# Patient Record
Sex: Female | Born: 1955
Health system: Southern US, Community
[De-identification: ages and names within clinical notes are randomized; demographics above are authoritative.]

## PROBLEM LIST (undated history)

## (undated) DIAGNOSIS — E559 Vitamin D deficiency, unspecified: Secondary | ICD-10-CM

## (undated) DIAGNOSIS — R32 Unspecified urinary incontinence: Secondary | ICD-10-CM

## (undated) DIAGNOSIS — D649 Anemia, unspecified: Secondary | ICD-10-CM

## (undated) DIAGNOSIS — K635 Polyp of colon: Secondary | ICD-10-CM

## (undated) DIAGNOSIS — R159 Full incontinence of feces: Secondary | ICD-10-CM

## (undated) DIAGNOSIS — L930 Discoid lupus erythematosus: Secondary | ICD-10-CM

## (undated) DIAGNOSIS — E7800 Pure hypercholesterolemia, unspecified: Secondary | ICD-10-CM

## (undated) HISTORY — PX: FOOT SURGERY: SHX648

## (undated) HISTORY — PX: CARPAL TUNNEL RELEASE: SHX101

## (undated) HISTORY — DX: Anemia, unspecified: D64.9

## (undated) HISTORY — PX: BREAST EXCISIONAL BIOPSY: SUR124

## (undated) HISTORY — DX: Unspecified urinary incontinence: R32

## (undated) HISTORY — DX: Polyp of colon: K63.5

## (undated) HISTORY — DX: Full incontinence of feces: R15.9

## (undated) HISTORY — DX: Discoid lupus erythematosus: L93.0

## (undated) HISTORY — DX: Vitamin D deficiency, unspecified: E55.9

---

## 1997-06-07 ENCOUNTER — Ambulatory Visit (HOSPITAL_COMMUNITY): Admission: RE | Admit: 1997-06-07 | Discharge: 1997-06-07 | Payer: Self-pay | Admitting: Internal Medicine

## 1997-06-11 ENCOUNTER — Ambulatory Visit (HOSPITAL_COMMUNITY): Admission: RE | Admit: 1997-06-11 | Discharge: 1997-06-11 | Payer: Self-pay | Admitting: Internal Medicine

## 1999-01-11 ENCOUNTER — Encounter: Payer: Self-pay | Admitting: Internal Medicine

## 1999-01-11 ENCOUNTER — Ambulatory Visit (HOSPITAL_COMMUNITY): Admission: RE | Admit: 1999-01-11 | Discharge: 1999-01-11 | Payer: Self-pay | Admitting: Internal Medicine

## 2004-09-25 ENCOUNTER — Ambulatory Visit (HOSPITAL_COMMUNITY): Admission: RE | Admit: 2004-09-25 | Discharge: 2004-09-25 | Payer: Self-pay | Admitting: Gastroenterology

## 2009-10-31 ENCOUNTER — Ambulatory Visit (HOSPITAL_COMMUNITY): Admission: RE | Admit: 2009-10-31 | Discharge: 2009-10-31 | Payer: Self-pay | Admitting: Gastroenterology

## 2009-10-31 DIAGNOSIS — E119 Type 2 diabetes mellitus without complications: Secondary | ICD-10-CM | POA: Insufficient documentation

## 2010-04-23 ENCOUNTER — Encounter: Payer: Self-pay | Admitting: Obstetrics and Gynecology

## 2010-06-12 ENCOUNTER — Ambulatory Visit: Payer: Commercial Managed Care - PPO | Admitting: Family Medicine

## 2010-06-12 DIAGNOSIS — E669 Obesity, unspecified: Secondary | ICD-10-CM

## 2010-06-12 DIAGNOSIS — L930 Discoid lupus erythematosus: Secondary | ICD-10-CM

## 2010-06-12 DIAGNOSIS — E785 Hyperlipidemia, unspecified: Secondary | ICD-10-CM

## 2010-06-12 NOTE — Progress Notes (Signed)
  Medical Nutrition Therapy:  Appt start time: 0930 end time:  1030.  Assessment:  Primary concerns today: Blood sugar control. Usual eating pattern is erratic.  Maize sometimes skips meals; usually eats something for bkfst after getting to work; sometimes snacks through dinner.  Her husband works at [employer], and he often brings home food for the two kids (age 53 & 34).  Avoided foods include certain veg's.  Everyday foods or beverages include water only, and a max of 1 regular soda/day (usually less).  No usual physical activity.  24-hr recall suggests intake of <700 Kcal (up ~7:30): B- none; L (2 PM)- 1 c green beans w/ marg, 1 c potato salad, 4 oz ham, 1 pc corn bread, water.  Burgess Estelle was a typical Sunday, but other days usually include more meals/snacks.  No BG tested yesterday.  FBG has been 95-140.  Correen started taking Janumet 2 X day in Feb, but has cut back to 1 pill/day b/c bedtime BG has often been fairly low, i.e., 89.  She is not taking the one Janumet at consistent times during the day:  Sometimes at bkfast, sometimes at lunch, sometimes missing altogether.    Progress Towards Goal(s):  In progress.   Intervention:  Nutrition education.    Nutritional Diagnosis:  NB-2.1 Physical inactivity As related to time constraints and poor motivation.  As evidenced by no regular exercise.  Additional concern is erratic eating schedule and skipping meals.      Monitoring/Evaluation:  Dietary intake in 4 week(s).

## 2010-06-12 NOTE — Patient Instructions (Addendum)
-   Call Dr. Alberteen Sam to ask if she wants you to keep taking your Rx vitamin D for 6 months.  If you are not getting good results with the Rx you're using, ask Dr. Alberteen Sam to explore the option of Replesta, a D3 preparation in 50,000 IU form.   - If you have an especially high BG reading or a very low reading, make a note as to likely reason(s), i.e., when and what was the last meal/snack you ate?  Was the high or low related to medication? - Get on a consistent schedule of both eating and medication (Janumet pre-bkfst) for your blood sugar.   - Eat at least 3 meals and 1-2 snacks per day.  Aim for no more than 5 hours between eating.  - Breakfast options:  Lean meat sandwich on whole wheat; egg salad sandwich with low-fat mayo; 2 hard-boiled eggs with whole-grain bagel; yogurt and fresh fruit; high-fiber cereal (at least 5 g fiber per serving) with non-fat milk or yogurt.   - Obtain twice as many veg's as protein or carbohydrate foods for both lunch and dinner. - Switch to 2% milk for about a month, then to 1% for a couple of months, and eventually:  Non-fat milk.   - PLAN AHEAD for this to work well.   - Physical activity: Get up and move at least once an hour (exercise opportunities).   - Physical activity goal:  Walk at least 15 minutes 3 X wk.  Record # minutes walked daily on BG log.

## 2010-06-16 LAB — GLUCOSE, CAPILLARY: Glucose-Capillary: 223 mg/dL — ABNORMAL HIGH (ref 70–99)

## 2010-07-10 ENCOUNTER — Ambulatory Visit (INDEPENDENT_AMBULATORY_CARE_PROVIDER_SITE_OTHER): Payer: Commercial Managed Care - PPO | Admitting: Family Medicine

## 2010-07-10 DIAGNOSIS — E119 Type 2 diabetes mellitus without complications: Secondary | ICD-10-CM

## 2010-07-10 NOTE — Progress Notes (Signed)
  Medical Nutrition Therapy:  Appt start time: 0930 end time:  1030.  Assessment:  Primary concerns today: Blood sugar control. Theresa Meyers has been attending Zumba class 1 X wk and Body Pump class 1 X wk.  She has been eating breakfast most mornings, but still has some days of no bkfst or of skipping lunch on a busy weekend day. She has switched to 2% milk, and has been off sodas for 40 days.  BG have been down some in the past month; 30-day avg is 109.  She has had two high FBG (140), which she could not associate with specific foods the previous day, although she did not consider meals/snacks prior to the previous night's dinner.  She admits she needs to do a better job of planning ahead for meals.    Progress Towards Goal(s):  In progress.   Intervention:  Nutrition education.    Nutritional Diagnosis:  NB-2.1 Some progress on physical inactivity as related to time constraints as evidenced by 2 exercise classes a week.  Some progress also on erratic eating schedule and skipping meals.     Monitoring/Evaluation:  Dietary intake in 4 weeks.

## 2010-07-10 NOTE — Patient Instructions (Addendum)
-   Walking after work:  Consider places you can go that will continue to make it interesting for you.   - Seven days a week, 24 hrs a day = 168 hours; subtract 7 hours of sleep a night = 119 hours. Does exercising 2 hours sound like it's enough? - Exercise opportunities:  Move as often as possible.  - Google:  Non-exercise activity thermogenesis (NEAT) and Google Lafonda Mosses (sugar research).   - Daily food record.  Use this when you see a BG that is high or low to determine why.   - Reminder:  Eat at least 3 meals and 1-2 snacks per day.  Aim for no more than 5 hours between eating.  It's important to make eating a priority.   - VEGETABLES:  Aim for 2 X day, and A LOT when you eat them.  If you don't include a veg with lunch or dinner, then at least have a fruit.  Consider using the microwave for veg's.   - PLAN AHEAD FOR MEALS AND SNACKS.

## 2010-08-05 ENCOUNTER — Inpatient Hospital Stay (INDEPENDENT_AMBULATORY_CARE_PROVIDER_SITE_OTHER)
Admission: RE | Admit: 2010-08-05 | Discharge: 2010-08-05 | Disposition: A | Discharge status: Home / Self Care | Payer: 59 | Source: Ambulatory Visit | Attending: Family Medicine | Admitting: Family Medicine

## 2010-08-05 ENCOUNTER — Encounter: Payer: Self-pay | Admitting: Family Medicine

## 2010-08-05 DIAGNOSIS — T783XXA Angioneurotic edema, initial encounter: Secondary | ICD-10-CM

## 2010-08-05 DIAGNOSIS — E785 Hyperlipidemia, unspecified: Secondary | ICD-10-CM | POA: Insufficient documentation

## 2010-08-07 ENCOUNTER — Telehealth (INDEPENDENT_AMBULATORY_CARE_PROVIDER_SITE_OTHER): Payer: Self-pay | Admitting: *Deleted

## 2010-08-07 ENCOUNTER — Ambulatory Visit (INDEPENDENT_AMBULATORY_CARE_PROVIDER_SITE_OTHER): Payer: 59 | Admitting: Family Medicine

## 2010-08-07 DIAGNOSIS — E119 Type 2 diabetes mellitus without complications: Secondary | ICD-10-CM

## 2010-08-07 NOTE — Progress Notes (Signed)
  Medical Nutrition Therapy:  Appt start time: 0930 end time:  1015.  Assessment:  Primary concerns today: Blood sugar control. Theresa Meyers has not been exercising as much as she had planned.  FBG have been good; <100 most mornings.  Usual intk on weekdays:  B- 2 boiled eggs, 1 Yoplait yogurt with granola;  L- [place] sandwich w/ regular Jell-O or popcorn; Snk- maybe fruit or pb crackers; D- sporadic (may be bought sandwich or grilled chx).  She is getting veg's 2-3 X wk, and fruit daily.  Theresa Meyers acknowledges that advance planning will be crucial to better food choices and to exercise.    Progress Towards Goal(s):  In progress.   Intervention:  Nutrition education.    Nutritional Diagnosis:  NB-2.1 No progress on physical inactivity as related to time constraints as evidenced by sporadic exercise recently.  Some continued progress on erratic eating schedule and skipping meals.     Monitoring/Evaluation:  Dietary intake in 4 weeks.

## 2010-08-07 NOTE — Patient Instructions (Addendum)
-   Daily food record.  This may be helpful in determining what's causing your lip inflammation.   - While taking prednisone, be especially cautious about sweets and carb's  In your diet (try to limit).   - Increase veg's; include veg's at least once a day.  Remember SALADS, and use your microwave for veg's.  Be sure to keep frozen veg's on hand.  Try stir-frying greens.   - Google:  Lafonda Mosses, and listen to one of his videos.   - Use your desk exercises list, and continue to try to walk briefly at least once an hour while at work.   - Walking goal:  At least 15 min 5 X wk.   - The key to staying on track with making good food choices and to getting exercise is PLANNING AHEAD.

## 2010-08-18 NOTE — Op Note (Signed)
Theresa Meyers, Theresa Meyers                ACCOUNT NO.:  [id number]   MEDICAL RECORD NO.:  [id number]          PATIENT TYPE:  AMB   LOCATION:  ENDO                         FACILITY:  [facility]   PHYSICIAN:  Anselmo Rod, M.D.  DATE OF BIRTH:  03-27-1956   DATE OF PROCEDURE:  09/25/2004  DATE OF DISCHARGE:                                 OPERATIVE REPORT   PROCEDURE PERFORMED:  Screening colonoscopy.   ENDOSCOPIST:  Anselmo Rod, M.D.   INSTRUMENT USED:  Olympus video colonoscope.   INDICATIONS FOR PROCEDURE:  A 55 year old, African-American female  undergoing a screening colonoscopy.  The patient has a history of occasional  rectal bleeding, constipation, family history of colon cancer in father.  To  rule out colonic polyps, masses, etc.   PRE-PROCEDURE PREPARATION:  Informed consent was procured from the patient.  The patient was fasted for eight hours prior to the procedure and prepped  with a bottle of magnesium citrate and a gallon of GoLYTELY the night prior  to the procedure.  The risks and benefits of the procedure including a 10%  miss rate of cancer and polyps were discussed with the patient as well.   PRE-PROCEDURE PHYSICAL:  VITAL SIGNS:  The patient had stable vital signs.  NECK:  Supple.  CHEST:  Clear to auscultation.  CARDIOVASCULAR:  S1, S2 regular.  ABDOMEN:  Soft, with normal bowel sounds.   DESCRIPTION OF THE PROCEDURE:  The patient was placed in the left lateral  decubitus position, sedated with 60 mg of Demerol and 7.5 mg of Versed in  slow incremental doses.  Once the patient was adequately sedated and  maintained on low-flow oxygen and continuous cardiac monitoring, the Olympus  video colonoscope was advanced from the rectum to the cecum.  The  appendiceal orifice and the ileocecal valve were clearly visualized and  photographed.  No masses, polyps, erosions, ulcerations, or diverticula were  seen.  Small internal hemorrhoids were appreciated on retroflexion  in the  rectum.  There was a significant amount of residual stool in the colon, as  the patient did not consume her prep completely.  Multiple washes were done.  Small lesions could be missed.   IMPRESSION:  1.  Normal colonoscopy up to the cecum.  No masses, polyps, or diverticula      seen.  2.  Small internal hemorrhoids seen on retroflexion.   RECOMMENDATIONS:  1.  Repeat colonoscopy has been recommended in the next five years unless      the patient develops any abnormal      symptoms in the interim.  2.  Continue a high-fiber diet with liberal fluid intake.  Use stool      softeners as needed.  3.  Outpatient followup as the need arises in the future.       JNM/MEDQ  D:  09/25/2004  T:  09/25/2004  Job:  [id number]   cc:   Birdena Jubilee, MD  [address]  [place]  [place] [address]  Fax: [phone]   Annye Rusk, M.D.  [facility]  [employer]  [place]

## 2010-09-05 ENCOUNTER — Ambulatory Visit: Payer: 59 | Admitting: Family Medicine

## 2011-03-05 NOTE — Progress Notes (Signed)
Summary: LIPS BURNING/WSE   Vital Signs:  Patient Profile:   55 Years Old Female CC:      burning lips Height:     62.5 inches Weight:      190 pounds O2 Sat:      100 % O2 treatment:    Room Air Temp:     97.7 degrees F oral Pulse rate:   82 / minute Resp:     20 per minute BP sitting:   130 / 87  (left arm) Cuff size:   regular  Vitals Entered By: Linton Flemings RN (Aug 05, 2010 5:44 PM)                  Updated Prior Medication List: Irven Coe * PRAVACHOL daily  History of Present Illness Chief Complaint: burning lips History of Present Illness:  Subjective:  Patient complains of onset of itchy lips two days ago without swelling or pain, and no lesions.  No sores inside of mouth.  She feels well otherwise.  She uses lipstick but has had no recent change.  She has had a similar episode in the past that did involve swelling, and it resolved spontaneously.  No recent change in meds.  REVIEW OF SYSTEMS Constitutional Symptoms      Denies fever, chills, night sweats, weight loss, weight gain, and fatigue.  Eyes       Denies change in vision, eye pain, eye discharge, glasses, contact lenses, and eye surgery. Ear/Nose/Throat/Mouth       Denies hearing loss/aids, change in hearing, ear pain, ear discharge, dizziness, frequent runny nose, frequent nose bleeds, sinus problems, sore throat, hoarseness, and tooth pain or bleeding.  Respiratory       Denies dry cough, productive cough, wheezing, shortness of breath, asthma, bronchitis, and emphysema/COPD.  Cardiovascular       Denies murmurs, chest pain, and tires easily with exhertion.    Gastrointestinal       Denies stomach pain, nausea/vomiting, diarrhea, constipation, blood in bowel movements, and indigestion. Genitourniary       Denies painful urination, kidney stones, and loss of urinary control. Neurological       Complains of numbness and tingling.      Denies paralysis, seizures, and  fainting/blackouts. Musculoskeletal       Denies muscle pain, joint pain, joint stiffness, decreased range of motion, redness, swelling, muscle weakness, and gout.  Skin       Denies bruising, unusual mles/lumps or sores, and hair/skin or nail changes.  Psych       Denies mood changes, temper/anger issues, anxiety/stress, speech problems, depression, and sleep problems. Other Comments: states burning to lips and outter portion of lips has darken x 3 days   Past History:  Past Medical History: DM Hyperlipidemia  Past Surgical History: Denies surgical history  Family History: none  Social History: smoke- no alcohol- no rec, drugs- no   Objective:  No acute distress  Eyes:  Pupils are equal, round, and reactive to light and accomdation.  Extraocular movement is intact.  Conjunctivae are not inflamed.  Lips:  No obvious swelling.  No lesions.  No tenderness.  No erythema. Mouth:  No lesions.  No gingival tenderness Pharynx:  Normal  Neck:  Supple.  No adenopathy is present.  No thyromegaly is present  Assessment New Problems: ANGIOEDEMA (ICD-995.1) HYPERLIPIDEMIA (ICD-272.4)  SUSPECT A VERY MILD MANIFESTATION OF ANGIOEDEMA  Plan New Medications/Changes: PREDNISONE 10 MG TABS (PREDNISONE) 2 PO today, then 2 BID for  1 day, then 1 two times a day for 2 days, then 1 daily for 2 days.  Take PC  #12 x 0, 08/05/2010, Donna Christen MD  New Orders: New Patient Level III [phone] Services provided After hours-Weekends-Holidays [99051] Planning Comments:   Short tapering course of prednisone.  Advised to stop using lipstick, lip balms, etc, until resolved. Follow-up with ENT if not improving.   The patient and/or caregiver has been counseled thoroughly with regard to medications prescribed including dosage, schedule, interactions, rationale for use, and possible side effects and they verbalize understanding.  Diagnoses and expected course of recovery discussed and will return if  not improved as expected or if the condition worsens. Patient and/or caregiver verbalized understanding.  Prescriptions: PREDNISONE 10 MG TABS (PREDNISONE) 2 PO today, then 2 BID for 1 day, then 1 two times a day for 2 days, then 1 daily for 2 days.  Take PC  #12 x 0   Entered and Authorized by:   Donna Christen MD   Signed by:   Donna Christen MD on 08/05/2010   Method used:   Print then Give to Patient   RxID:   [id number]   Orders Added: 1)  New Patient Level III [95621] 2)  Services provided After hours-Weekends-Holidays [30865]

## 2011-03-05 NOTE — Telephone Encounter (Signed)
  Phone Note Outgoing Call Call back at [facility] Phone [phone] [facility]     Call placed by: Lajean Saver RN,  Aug 07, 2010 12:30 PM Call placed to: Patient Summary of Call: Callback: No answer. message left to call back with questions or concerns

## 2011-11-12 ENCOUNTER — Encounter (HOSPITAL_COMMUNITY): Payer: Self-pay | Admitting: *Deleted

## 2011-11-12 ENCOUNTER — Emergency Department (HOSPITAL_COMMUNITY)
Admission: EM | Admit: 2011-11-12 | Discharge: 2011-11-12 | Disposition: A | Discharge status: Home / Self Care | Payer: 59 | Source: Home / Self Care | Attending: Family Medicine | Admitting: Family Medicine

## 2011-11-12 DIAGNOSIS — S76311A Strain of muscle, fascia and tendon of the posterior muscle group at thigh level, right thigh, initial encounter: Secondary | ICD-10-CM

## 2011-11-12 HISTORY — DX: Pure hypercholesterolemia, unspecified: E78.00

## 2011-11-12 NOTE — ED Notes (Signed)
Reports slipping while getting dressed on Thursday, causing pain in right hamstring.  Has applied ice.

## 2011-11-12 NOTE — ED Provider Notes (Signed)
History     CSN: [id number]  Arrival date & time 11/12/11  1727   First MD Initiated Contact with Patient 11/12/11 1738      Chief Complaint  Patient presents with  . Leg Pain    (Consider location/radiation/quality/duration/timing/severity/associated sxs/prior treatment) Patient is a 56 y.o. female presenting with leg pain. The history is provided by the patient.  Leg Pain  The incident occurred more than 2 days ago. The incident occurred at home. The injury mechanism was a fall (slipped getting dressed felt pain in right post thigh.). The pain is present in the right thigh. The quality of the pain is described as burning and sharp. The pain is mild. The pain has been intermittent since onset.    Past Medical History  Diagnosis Date  . Diabetes mellitus   . Hypercholesteremia     Past Surgical History  Procedure Date  . Carpal tunnel release   . Foot surgery     No family history on file.  History  Substance Use Topics  . Smoking status: Never Smoker   . Smokeless tobacco: Not on file  . Alcohol Use: No    OB History    Grav Para Term Preterm Abortions TAB SAB Ect Mult Living                  Review of Systems  Constitutional: Negative.   Gastrointestinal: Negative.   Genitourinary: Negative.   Musculoskeletal: Positive for gait problem. Negative for back pain.  Neurological: Negative.     Allergies  Review of patient's allergies indicates no known allergies.  Home Medications   Current Outpatient Rx  Name Route Sig Dispense Refill  . ERGOCALCIFEROL 50000 UNITS PO CAPS Oral Take 50,000 Units by mouth 2 (two) times a week.      Marland Kitchen PRAVASTATIN SODIUM PO Oral Take by mouth at bedtime.      Marland Kitchen SITAGLIPTIN-METFORMIN HCL 50-1000 MG PO TABS Oral Take 1 tablet by mouth 1 dose over 46 hours.        BP 130/76  Pulse 76  Temp 97.3 F (36.3 C) (Oral)  Resp 18  SpO2 100%  Physical Exam  Nursing note and vitals reviewed. Constitutional: She is oriented to  person, place, and time. She appears well-developed and well-nourished.  Abdominal: Soft. Bowel sounds are normal.  Musculoskeletal: She exhibits tenderness.       Legs: Neurological: She is alert and oriented to person, place, and time.  Skin: Skin is warm and dry.    ED Course  Procedures (including critical care time)  Labs Reviewed - No data to display No results found.   1. Right hamstring muscle strain       MDM          Linna Hoff, MD 11/12/11 [phone]

## 2011-11-27 ENCOUNTER — Other Ambulatory Visit: Payer: Self-pay | Admitting: Obstetrics and Gynecology

## 2011-11-27 DIAGNOSIS — Z1231 Encounter for screening mammogram for malignant neoplasm of breast: Secondary | ICD-10-CM

## 2011-12-14 ENCOUNTER — Ambulatory Visit
Admission: RE | Admit: 2011-12-14 | Discharge: 2011-12-14 | Disposition: A | Discharge status: Home / Self Care | Payer: 59 | Source: Ambulatory Visit | Attending: Obstetrics and Gynecology | Admitting: Obstetrics and Gynecology

## 2011-12-14 DIAGNOSIS — Z1231 Encounter for screening mammogram for malignant neoplasm of breast: Secondary | ICD-10-CM

## 2011-12-27 ENCOUNTER — Other Ambulatory Visit: Payer: Self-pay | Admitting: Obstetrics and Gynecology

## 2011-12-27 DIAGNOSIS — R928 Other abnormal and inconclusive findings on diagnostic imaging of breast: Secondary | ICD-10-CM

## 2011-12-28 ENCOUNTER — Ambulatory Visit
Admission: RE | Admit: 2011-12-28 | Discharge: 2011-12-28 | Disposition: A | Discharge status: Home / Self Care | Payer: 59 | Source: Ambulatory Visit | Attending: Obstetrics and Gynecology | Admitting: Obstetrics and Gynecology

## 2011-12-28 DIAGNOSIS — R928 Other abnormal and inconclusive findings on diagnostic imaging of breast: Secondary | ICD-10-CM

## 2012-05-17 ENCOUNTER — Other Ambulatory Visit: Payer: Self-pay

## 2012-07-31 ENCOUNTER — Telehealth: Payer: Self-pay | Admitting: *Deleted

## 2012-07-31 MED ORDER — ERGOCALCIFEROL 1.25 MG (50000 UT) PO CAPS: 50000.0000 [IU] | ORAL_CAPSULE | ORAL | Status: DC

## 2012-07-31 MED ORDER — PRAVASTATIN SODIUM 40 MG PO TABS: 40.0000 mg | ORAL_TABLET | Freq: Every day | ORAL | Status: DC

## 2012-07-31 NOTE — Telephone Encounter (Signed)
PT CALLED SCHED APPT FOR LABS AND FOLLOW UP  PT ADVISED IS OUT OF CHOLESTEROL MED. AND VITAMIN D.  PHARMACY- [facility]

## 2012-08-01 ENCOUNTER — Other Ambulatory Visit: Payer: Self-pay | Admitting: Family Medicine

## 2012-08-01 ENCOUNTER — Other Ambulatory Visit: Payer: 59

## 2012-08-01 DIAGNOSIS — E785 Hyperlipidemia, unspecified: Secondary | ICD-10-CM

## 2012-08-01 DIAGNOSIS — E559 Vitamin D deficiency, unspecified: Secondary | ICD-10-CM

## 2012-08-01 DIAGNOSIS — D649 Anemia, unspecified: Secondary | ICD-10-CM

## 2012-08-01 DIAGNOSIS — IMO0001 Reserved for inherently not codable concepts without codable children: Secondary | ICD-10-CM

## 2012-08-01 LAB — OTHER SOLSTAS TEST
%SAT: 19 % — ABNORMAL LOW (ref 20–55)
ABS Retic: 41 10*3/uL (ref 19.0–186.0)
Basophils Absolute: 0 10*3/uL (ref 0.0–0.1)
Basophils Relative: 0 % (ref 0–1)
Eosinophils Absolute: 0.1 10*3/uL (ref 0.0–0.7)
Eosinophils Relative: 3 % (ref 0–5)
Ferritin: 23 ng/mL (ref 10–291)
HCT: 37.9 % (ref 36.0–46.0)
Hemoglobin: 12.5 g/dL (ref 12.0–15.0)
Iron: 78 ug/dL (ref 42–145)
Lymphocytes Relative: 44 % (ref 12–46)
Lymphs Abs: 1.7 10*3/uL (ref 0.7–4.0)
MCH: 27.5 pg (ref 26.0–34.0)
MCHC: 33 g/dL (ref 30.0–36.0)
MCV: 83.3 fL (ref 78.0–100.0)
Monocytes Absolute: 0.3 10*3/uL (ref 0.1–1.0)
Monocytes Relative: 8 % (ref 3–12)
Neutro Abs: 1.8 10*3/uL (ref 1.7–7.7)
Neutrophils Relative %: 45 % (ref 43–77)
Platelets: 320 10*3/uL (ref 150–400)
RBC.: 4.55 MIL/uL (ref 3.87–5.11)
RBC: 4.55 MIL/uL (ref 3.87–5.11)
RDW: 17.1 % — ABNORMAL HIGH (ref 11.5–15.5)
Retic Ct Pct: 0.9 % (ref 0.4–2.3)
TIBC: 405 ug/dL (ref 250–470)
UIBC: 327 ug/dL (ref 125–400)
Vitamin B-12: 298 pg/mL (ref 211–911)
WBC: 4 10*3/uL (ref 4.0–10.5)

## 2012-08-01 LAB — HEMOGLOBIN A1C
Hgb A1c MFr Bld: 6.5 % — ABNORMAL HIGH (ref <5.7)
Mean Plasma Glucose: 140 mg/dL — ABNORMAL HIGH (ref <117)

## 2012-08-01 LAB — LIPID PANEL
Cholesterol: 201 mg/dL — ABNORMAL HIGH (ref 0–200)
HDL: 66 mg/dL (ref >39)
LDL Cholesterol: 125 mg/dL — ABNORMAL HIGH (ref 0–99)
Total CHOL/HDL Ratio: 3 Ratio
Triglycerides: 51 mg/dL (ref <150)
VLDL: 10 mg/dL (ref 0–40)

## 2012-08-02 LAB — VITAMIN D 25 HYDROXY (VIT D DEFICIENCY, FRACTURES): Vit D, 25-Hydroxy: 68 ng/mL (ref 30–89)

## 2012-08-07 ENCOUNTER — Ambulatory Visit: Payer: 59 | Admitting: Family Medicine

## 2012-08-19 ENCOUNTER — Encounter: Payer: Self-pay | Admitting: Family Medicine

## 2012-08-19 ENCOUNTER — Ambulatory Visit (INDEPENDENT_AMBULATORY_CARE_PROVIDER_SITE_OTHER): Payer: 59 | Admitting: Family Medicine

## 2012-08-19 VITALS — BP 116/77 | HR 67 | Ht 62.5 in | Wt 198.0 lb

## 2012-08-19 DIAGNOSIS — E785 Hyperlipidemia, unspecified: Secondary | ICD-10-CM

## 2012-08-19 DIAGNOSIS — E559 Vitamin D deficiency, unspecified: Secondary | ICD-10-CM

## 2012-08-19 DIAGNOSIS — E119 Type 2 diabetes mellitus without complications: Secondary | ICD-10-CM

## 2012-08-19 MED ORDER — PRAVASTATIN SODIUM 40 MG PO TABS: 40.0000 mg | ORAL_TABLET | Freq: Every day | ORAL | Status: DC

## 2012-08-19 MED ORDER — CANAGLIFLOZIN 300 MG PO TABS: 1.0000 | ORAL_TABLET | Freq: Every day | ORAL | Status: DC

## 2012-08-19 MED ORDER — ERGOCALCIFEROL 1.25 MG (50000 UT) PO CAPS: 50000.0000 [IU] | ORAL_CAPSULE | ORAL | Status: AC

## 2012-08-19 MED ORDER — SITAGLIP PHOS-METFORMIN HCL ER 50-1000 MG PO TB24: 1.0000 | ORAL_TABLET | Freq: Two times a day (BID) | ORAL | Status: DC

## 2012-08-19 NOTE — Progress Notes (Signed)
  Subjective:    Patient ID: Theresa Meyers, female    DOB: September 10, 1955, 57 y.o.   MRN: [id number]  HPI  Theresa Meyers is here today to discuss her most recent lab results, get medications refilled and to discuss the conditions listed below.   1) Type II DM:  She has done well on her Janumet and would like a refill on it.   2) Hyperlipidemia:  She needs a refill on her Pravastatin.  3) Vitamin D:  She takes her Vitamin D twice per week and needs a refill on it.    Review of Systems  Constitutional: Negative for activity change, fatigue and unexpected weight change.  HENT: Negative.   Eyes: Negative.   Respiratory: Negative for shortness of breath.   Cardiovascular: Negative for chest pain, palpitations and leg swelling.  Gastrointestinal: Negative for diarrhea and constipation.  Endocrine: Negative.   Genitourinary: Negative for difficulty urinating.  Musculoskeletal: Negative.   Skin: Negative.   Neurological: Negative.   Hematological: Negative for adenopathy. Does not bruise/bleed easily.  Psychiatric/Behavioral: Negative for sleep disturbance and dysphoric mood. The patient is not nervous/anxious.    Past Medical History  Diagnosis Date  . Diabetes mellitus   . Hypercholesteremia   . Vitamin D deficiency   . Colon polyp   . Anemia   . Discoid lupus     Dr. Cardell Peach   Family History  Problem Relation Age of Onset  . Diabetes Mother   . Hypertension Mother   . Diabetes Father   . Hypertension Father   . Cancer Father     Colon  . Rheum arthritis Sister   . Colon polyps Sister    History   Social History Narrative   Marital Status:  Married Brewing technologist)   Children:  Son Landry Mellow) 19; Daughter (Chanell) [phone].   Pets:  None   Living Situation: Lives with spouse and children.    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 No distress.  HENT:  Head: Normocephalic.  Eyes: No scleral icterus.  Neck: No thyromegaly present.  Cardiovascular: Normal rate, regular rhythm and normal heart sounds.   Pulmonary/Chest: Effort normal and breath sounds normal.  Abdominal: There is no tenderness.  Musculoskeletal: She exhibits no edema and no tenderness.  Neurological: She is alert.  Skin: Skin is warm and dry.  Psychiatric: She has a normal mood and affect. Her behavior is normal. Judgment and thought content normal.          Assessment & Plan:

## 2012-08-19 NOTE — Patient Instructions (Addendum)
1)  Vitamin D - Take 1 per week in the summer and 2 per week in the winter.  2)  Choleserol - Try to increase you intake of fiber to 30 grams per day (Metamucil, Oatmeal, Raisin Bran).  3)  Diabetes - We're adding Invokana 100 mg for 2 weeks then 300 mg.  If your sugar starts to be too low then decrease your Janumet to 1 per day.    Diabetes and Exercise Regular exercise is important and can help:   Control blood glucose (sugar).  Decrease blood pressure.    Control blood lipids (cholesterol, triglycerides).  Improve overall health. BENEFITS FROM EXERCISE  Improved fitness.  Improved flexibility.  Improved endurance.  Increased bone density.  Weight control.  Increased muscle strength.  Decreased body fat.  Improvement of the body's use of insulin, a hormone.  Increased insulin sensitivity.  Reduction of insulin needs.  Reduced stress and tension.  Helps you feel better. People with diabetes who add exercise to their lifestyle gain additional benefits, including:  Weight loss.  Reduced appetite.  Improvement of the body's use of blood glucose.  Decreased risk factors for heart disease:  Lowering of cholesterol and triglycerides.  Raising the level of good cholesterol (high-density lipoproteins, HDL).  Lowering blood sugar.  Decreased blood pressure. TYPE 1 DIABETES AND EXERCISE  Exercise will usually lower your blood glucose.  If blood glucose is greater than 240 mg/dl, check urine ketones. If ketones are present, do not exercise.  Location of the insulin injection sites may need to be adjusted with exercise. Avoid injecting insulin into areas of the body that will be exercised. For example, avoid injecting insulin into:  The arms when playing tennis.  The legs when jogging. For more information, discuss this with your caregiver.  Keep a record of:  Food intake.  Type and amount of exercise.  Expected peak times of insulin action.  Blood  glucose levels. Do this before, during, and after exercise. Review your records with your caregiver. This will help you to develop guidelines for adjusting food intake and insulin amounts.  TYPE 2 DIABETES AND EXERCISE  Regular physical activity can help control blood glucose.  Exercise is important because it may:  Increase the body's sensitivity to insulin.  Improve blood glucose control.  Exercise reduces the risk of heart disease. It decreases serum cholesterol and triglycerides. It also lowers blood pressure.  Those who take insulin or oral hypoglycemic agents should watch for signs of hypoglycemia. These signs include dizziness, shaking, sweating, chills, and confusion.  Body water is lost during exercise. It must be replaced. This will help to avoid loss of body fluids (dehydration) or heat stroke. Be sure to talk to your caregiver before starting an exercise program to make sure it is safe for you. Remember, any activity is better than none.  Document Released: 06/09/2003 Document Revised: 06/11/2011 Document Reviewed: 09/23/2008 [facility] Patient Information 2013 [place], [place].

## 2012-08-31 ENCOUNTER — Encounter: Payer: Self-pay | Admitting: Family Medicine

## 2012-08-31 DIAGNOSIS — E119 Type 2 diabetes mellitus without complications: Secondary | ICD-10-CM | POA: Insufficient documentation

## 2012-08-31 DIAGNOSIS — E559 Vitamin D deficiency, unspecified: Secondary | ICD-10-CM | POA: Insufficient documentation

## 2012-08-31 NOTE — Assessment & Plan Note (Signed)
Refilled her Vitamin D.   

## 2012-08-31 NOTE — Assessment & Plan Note (Addendum)
Her A1c is slightly improved from 6.7% to 6.5%.  She is going to add some Invokana to her Janumet to help lower her A1c further and to help her with weight loss.

## 2012-08-31 NOTE — Assessment & Plan Note (Signed)
Refilled her pravastatin and she is to increase her intake of fiber.

## 2012-08-31 NOTE — Assessment & Plan Note (Signed)
Refilled her pravastatin 

## 2012-11-14 ENCOUNTER — Ambulatory Visit (INDEPENDENT_AMBULATORY_CARE_PROVIDER_SITE_OTHER): Payer: 59 | Admitting: Family Medicine

## 2012-11-14 VITALS — BP 134/87 | HR 66 | Wt 197.0 lb

## 2012-11-14 DIAGNOSIS — E119 Type 2 diabetes mellitus without complications: Secondary | ICD-10-CM

## 2012-11-14 NOTE — Progress Notes (Signed)
Patient presents for 3 month follow up DM as part of the employee sponsored Link to [employer]. Medications and meter have been reviewed. I have also discussed with patient lifestyle interventions such as diet and exercise. Full documentation of this visit can be found in the caretracker documenting system through [employer] [facility]). However specific areas of concern include the following:  1.) A1C 6.2, at goal. Patient is not exercising. Goal is to walk 3x/week for 15 minutes with plans to increase later. 2.) patient needs to make an appointment with a podiatrist. She is having a lot of problems with bunyons. 3.) LDL 125, she is on a moderate intensity statin of pravastatin 40 mg but the LDL is still high. Doubling a statin only gets an addition 6% lowering. I will speak with patient and MD about options.    Patient has set a series of personal goals and will f/u in 3 months for further review of DM.

## 2012-12-03 ENCOUNTER — Encounter: Payer: Self-pay | Admitting: Family Medicine

## 2012-12-03 ENCOUNTER — Other Ambulatory Visit: Payer: Self-pay | Admitting: Family Medicine

## 2012-12-03 DIAGNOSIS — E785 Hyperlipidemia, unspecified: Secondary | ICD-10-CM

## 2012-12-03 MED ORDER — ATORVASTATIN CALCIUM 40 MG PO TABS: 40.0000 mg | ORAL_TABLET | Freq: Every day | ORAL | Status: DC

## 2012-12-18 NOTE — Progress Notes (Signed)
Patient ID: Summers C Girardot, female   DOB: 09/12/1955, 57 y.o.   MRN: [id number] ATTENDING PHYSICIAN NOTE: I have reviewed the chart and agree with the plan as detailed above. Ragan Reale MD Pager [phone]  

## 2013-02-05 ENCOUNTER — Other Ambulatory Visit: Payer: Self-pay

## 2013-02-06 ENCOUNTER — Other Ambulatory Visit: Payer: Self-pay

## 2013-02-06 ENCOUNTER — Other Ambulatory Visit (HOSPITAL_COMMUNITY): Payer: Self-pay | Admitting: Obstetrics and Gynecology

## 2013-02-06 DIAGNOSIS — Z1231 Encounter for screening mammogram for malignant neoplasm of breast: Secondary | ICD-10-CM

## 2013-02-20 ENCOUNTER — Ambulatory Visit (HOSPITAL_COMMUNITY): Payer: 59

## 2013-02-23 ENCOUNTER — Other Ambulatory Visit: Payer: Self-pay | Admitting: *Deleted

## 2013-02-23 DIAGNOSIS — E119 Type 2 diabetes mellitus without complications: Secondary | ICD-10-CM

## 2013-02-23 DIAGNOSIS — R5381 Other malaise: Secondary | ICD-10-CM

## 2013-02-23 DIAGNOSIS — D649 Anemia, unspecified: Secondary | ICD-10-CM

## 2013-02-24 ENCOUNTER — Ambulatory Visit (INDEPENDENT_AMBULATORY_CARE_PROVIDER_SITE_OTHER): Payer: Self-pay | Admitting: Family Medicine

## 2013-02-24 ENCOUNTER — Other Ambulatory Visit: Payer: 59

## 2013-02-24 ENCOUNTER — Other Ambulatory Visit: Payer: Self-pay | Admitting: Family Medicine

## 2013-02-24 VITALS — BP 124/75 | HR 68 | Wt 195.0 lb

## 2013-02-24 DIAGNOSIS — E119 Type 2 diabetes mellitus without complications: Secondary | ICD-10-CM

## 2013-02-24 LAB — ANEMIA PANEL 7
%SAT: 22 % (ref 20–55)
ABS Retic: 43.1 10*3/uL (ref 19.0–186.0)
Ferritin: 60 ng/mL (ref 10–291)
Folate: 11.3 ng/mL
HCT: 40.3 % (ref 36.0–46.0)
Hemoglobin: 13.5 g/dL (ref 12.0–15.0)
Iron: 73 ug/dL (ref 42–145)
MCH: 28.2 pg (ref 26.0–34.0)
MCHC: 33.5 g/dL (ref 30.0–36.0)
MCV: 84.1 fL (ref 78.0–100.0)
Platelets: 338 10*3/uL (ref 150–400)
RBC.: 4.79 MIL/uL (ref 3.87–5.11)
RBC: 4.79 MIL/uL (ref 3.87–5.11)
RDW: 15.9 % — ABNORMAL HIGH (ref 11.5–15.5)
Retic Ct Pct: 0.9 % (ref 0.4–2.3)
TIBC: 332 ug/dL (ref 250–470)
UIBC: 259 ug/dL (ref 125–400)
Vitamin B-12: 336 pg/mL (ref 211–911)
WBC: 4 10*3/uL (ref 4.0–10.5)

## 2013-02-24 LAB — COMPLETE METABOLIC PANEL WITH GFR
ALT: 19 U/L (ref 0–35)
AST: 16 U/L (ref 0–37)
Albumin: 4.1 g/dL (ref 3.5–5.2)
Alkaline Phosphatase: 78 U/L (ref 39–117)
BUN: 12 mg/dL (ref 6–23)
CO2: 27 mEq/L (ref 19–32)
Calcium: 9.2 mg/dL (ref 8.4–10.5)
Chloride: 105 mEq/L (ref 96–112)
Creat: 0.83 mg/dL (ref 0.50–1.10)
GFR, Est African American: >89 mL/min
GFR, Est Non African American: 80 mL/min
Glucose, Bld: 139 mg/dL — ABNORMAL HIGH (ref 70–99)
Potassium: 4.3 mEq/L (ref 3.5–5.3)
Sodium: 140 mEq/L (ref 135–145)
Total Bilirubin: 0.6 mg/dL (ref 0.3–1.2)
Total Protein: 7.4 g/dL (ref 6.0–8.3)

## 2013-02-24 LAB — TSH: TSH: 0.758 u[IU]/mL (ref 0.350–4.500)

## 2013-02-24 LAB — HEMOGLOBIN A1C
Hgb A1c MFr Bld: 7 % — ABNORMAL HIGH (ref <5.7)
Mean Plasma Glucose: 154 mg/dL — ABNORMAL HIGH (ref <117)

## 2013-02-24 NOTE — Progress Notes (Signed)
Patient presents for 3 mo f/u DM as part of the employee sponsored [employer] to [employer]. Medications have been reviewed. I have also discussed with patient lifestyle interventions such as diet and exercise. Full documentation of this visit can be found in the [employer] documenting system through [employer] [facility]). However specifics from this visit include the following:  Diabetes Mellitus: Other POC A1C 6.6, increased but still at goal. She hasn't been diabetic very long so it could be argued to push her <6.5. She had been prescribed invokana and her janumet decreased from 2 to 1 tablets. She hasn't been taking invokana consistently because it makes her urinate frequently which is inconvenient with her job. plan 1.) Go back up to 2 tablets on the janumet XR (this is what she was already doing previously but had difficulty remembering the morning dose). She is going to take both tablets at bedtime. I will call her in 1 wk to see if this is causing GI distress. MD appointment on december 8th. Stop invokana (she doesn't want to take it anymore). When I call her in 1 wk we will decide if we need to do something different depending on GI distress and I will contact MD. We could always separate out the Januvia and metformin and just increase the Januvia to 100 mg and leave the metformin at 1000 mg if she can't tolerate the increased metformin dose. I will f/u with MD after I speak with patient next week. 2.) try to exercise 2x/week for 10-15 mins  Hyperlipidemia: got her switched from pravastatin to lipitor 40 mg. She was on a moderate intensity pravastatin but she was not at non HDL goal hence why I made the suggestion to switch (also pravastatin will not be free in january). She is tolerating fine.  Cost Savings Intervention Outcomes: Medical problem identified  Patient has set a series of personal goals and will f/u in 3 mo for further review of DM.

## 2013-02-25 LAB — MICROALBUMIN, URINE: Microalb, Ur: 0.99 mg/dL (ref 0.00–1.89)

## 2013-03-03 ENCOUNTER — Ambulatory Visit (HOSPITAL_COMMUNITY)
Admission: RE | Admit: 2013-03-03 | Discharge: 2013-03-03 | Disposition: A | Discharge status: Home / Self Care | Payer: 59 | Source: Ambulatory Visit | Attending: Obstetrics and Gynecology | Admitting: Obstetrics and Gynecology

## 2013-03-03 ENCOUNTER — Ambulatory Visit (HOSPITAL_COMMUNITY): Payer: 59

## 2013-03-03 DIAGNOSIS — Z1231 Encounter for screening mammogram for malignant neoplasm of breast: Secondary | ICD-10-CM | POA: Insufficient documentation

## 2013-03-04 ENCOUNTER — Ambulatory Visit (HOSPITAL_COMMUNITY): Payer: 59

## 2013-03-09 ENCOUNTER — Ambulatory Visit (INDEPENDENT_AMBULATORY_CARE_PROVIDER_SITE_OTHER): Payer: 59 | Admitting: Family Medicine

## 2013-03-09 ENCOUNTER — Encounter: Payer: Self-pay | Admitting: Family Medicine

## 2013-03-09 VITALS — BP 124/84 | HR 85 | Resp 16 | Ht 62.5 in | Wt 193.0 lb

## 2013-03-09 DIAGNOSIS — M2010 Hallux valgus (acquired), unspecified foot: Secondary | ICD-10-CM

## 2013-03-09 DIAGNOSIS — M722 Plantar fascial fibromatosis: Secondary | ICD-10-CM | POA: Insufficient documentation

## 2013-03-09 DIAGNOSIS — E785 Hyperlipidemia, unspecified: Secondary | ICD-10-CM

## 2013-03-09 DIAGNOSIS — IMO0001 Reserved for inherently not codable concepts without codable children: Secondary | ICD-10-CM | POA: Insufficient documentation

## 2013-03-09 DIAGNOSIS — M21619 Bunion of unspecified foot: Secondary | ICD-10-CM | POA: Insufficient documentation

## 2013-03-09 MED ORDER — ATORVASTATIN CALCIUM 40 MG PO TABS: 40.0000 mg | ORAL_TABLET | Freq: Every day | ORAL | Status: DC

## 2013-03-09 MED ORDER — CANAGLIFLOZIN 300 MG PO TABS: 1.0000 | ORAL_TABLET | Freq: Every day | ORAL | Status: DC

## 2013-03-09 MED ORDER — SITAGLIP PHOS-METFORMIN HCL ER 50-1000 MG PO TB24: 1.0000 | ORAL_TABLET | Freq: Two times a day (BID) | ORAL | Status: AC

## 2013-03-09 NOTE — Patient Instructions (Signed)
1)  Type II DM - Increase your Janumet XR to 2 per day; try taking the Invokana at night vs 150 (1/2) twice a day. Try to add some exercise to your daily routine.    2)  Cholesterol - Stay on the Lipitor; we'll recheck your lipid panel in 3 months when we recheck your A1c.    3)  Plantar Fasciitis/Bunions  - You may want to see Dr. Ralene Cork.    Plantar Fasciitis (Heel Spur Syndrome) with Rehab The plantar fascia is a fibrous, ligament-like, soft-tissue structure that spans the bottom of the foot. Plantar fasciitis is a condition that causes pain in the foot due to inflammation of the tissue. SYMPTOMS   Pain and tenderness on the underneath side of the foot.  Pain that worsens with standing or walking. CAUSES  Plantar fasciitis is caused by irritation and injury to the plantar fascia on the underneath side of the foot. Common mechanisms of injury include:  Direct trauma to bottom of the foot.  Damage to a small nerve that runs under the foot where the main fascia attaches to the heel bone.  Stress placed on the plantar fascia due to bone spurs. RISK INCREASES WITH:   Activities that place stress on the plantar fascia (running, jumping, pivoting, or cutting).  Poor strength and flexibility.  Improperly fitted shoes.  Tight calf muscles.  Flat feet.  Failure to warm-up properly before activity.  Obesity. PREVENTION  Warm up and stretch properly before activity.  Allow for adequate recovery between workouts.  Maintain physical fitness:  Strength, flexibility, and endurance.  Cardiovascular fitness.  Maintain a health body weight.  Avoid stress on the plantar fascia.  Wear properly fitted shoes, including arch supports for individuals who have flat feet. PROGNOSIS  If treated properly, then the symptoms of plantar fasciitis usually resolve without surgery. However, occasionally surgery is necessary. RELATED COMPLICATIONS   Recurrent symptoms that may result in a  chronic condition.  Problems of the lower back that are caused by compensating for the injury, such as limping.  Pain or weakness of the foot during push-off following surgery.  Chronic inflammation, scarring, and partial or complete fascia tear, occurring more often from repeated injections. TREATMENT  Treatment initially involves the use of ice and medication to help reduce pain and inflammation. The use of strengthening and stretching exercises may help reduce pain with activity, especially stretches of the Achilles tendon. These exercises may be performed at home or with a therapist. Your caregiver may recommend that you use heel cups of arch supports to help reduce stress on the plantar fascia. Occasionally, corticosteroid injections are given to reduce inflammation. If symptoms persist for greater than 6 months despite non-surgical (conservative), then surgery may be recommended.  MEDICATION   If pain medication is necessary, then nonsteroidal anti-inflammatory medications, such as aspirin and ibuprofen, or other minor pain relievers, such as acetaminophen, are often recommended.  Do not take pain medication within 7 days before surgery.  Prescription pain relievers may be given if deemed necessary by your caregiver. Use only as directed and only as much as you need.  Corticosteroid injections may be given by your caregiver. These injections should be reserved for the most serious cases, because they may only be given a certain number of times. HEAT AND COLD  Cold treatment (icing) relieves pain and reduces inflammation. Cold treatment should be applied for 10 to 15 minutes every 2 to 3 hours for inflammation and pain and immediately after any  activity that aggravates your symptoms. Use ice packs or massage the area with a piece of ice (ice massage).  Heat treatment may be used prior to performing the stretching and strengthening activities prescribed by your caregiver, physical  therapist, or athletic trainer. Use a heat pack or soak the injury in warm water. SEEK IMMEDIATE MEDICAL CARE IF:  Treatment seems to offer no benefit, or the condition worsens.  Any medications produce adverse side effects. EXERCISES RANGE OF MOTION (ROM) AND STRETCHING EXERCISES - Plantar Fasciitis (Heel Spur Syndrome) These exercises may help you when beginning to rehabilitate your injury. Your symptoms may resolve with or without further involvement from your physician, physical therapist or athletic trainer. While completing these exercises, remember:   Restoring tissue flexibility helps normal motion to return to the joints. This allows healthier, less painful movement and activity.  An effective stretch should be held for at least 30 seconds.  A stretch should never be painful. You should only feel a gentle lengthening or release in the stretched tissue. RANGE OF MOTION - Toe Extension, Flexion  Sit with your right / left leg crossed over your opposite knee.  Grasp your toes and gently pull them back toward the top of your foot. You should feel a stretch on the bottom of your toes and/or foot.  Hold this stretch for __________ seconds.  Now, gently pull your toes toward the bottom of your foot. You should feel a stretch on the top of your toes and or foot.  Hold this stretch for __________ seconds. Repeat __________ times. Complete this stretch __________ times per day.  RANGE OF MOTION - Ankle Dorsiflexion, Active Assisted  Remove shoes and sit on a chair that is preferably not on a carpeted surface.  Place right / left foot under knee. Extend your opposite leg for support.  Keeping your heel down, slide your right / left foot back toward the chair until you feel a stretch at your ankle or calf. If you do not feel a stretch, slide your bottom forward to the edge of the chair, while still keeping your heel down.  Hold this stretch for __________ seconds. Repeat __________  times. Complete this stretch __________ times per day.  STRETCH  Gastroc, Standing  Place hands on wall.  Extend right / left leg, keeping the front knee somewhat bent.  Slightly point your toes inward on your back foot.  Keeping your right / left heel on the floor and your knee straight, shift your weight toward the wall, not allowing your back to arch.  You should feel a gentle stretch in the right / left calf. Hold this position for __________ seconds. Repeat __________ times. Complete this stretch __________ times per day. STRETCH  Soleus, Standing  Place hands on wall.  Extend right / left leg, keeping the other knee somewhat bent.  Slightly point your toes inward on your back foot.  Keep your right / left heel on the floor, bend your back knee, and slightly shift your weight over the back leg so that you feel a gentle stretch deep in your back calf.  Hold this position for __________ seconds. Repeat __________ times. Complete this stretch __________ times per day. STRETCH  Gastrocsoleus, Standing  Note: This exercise can place a lot of stress on your foot and ankle. Please complete this exercise only if specifically instructed by your caregiver.   Place the ball of your right / left foot on a step, keeping your other foot firmly on  the same step.  Hold on to the wall or a rail for balance.  Slowly lift your other foot, allowing your body weight to press your heel down over the edge of the step.  You should feel a stretch in your right / left calf.  Hold this position for __________ seconds.  Repeat this exercise with a slight bend in your right / left knee. Repeat __________ times. Complete this stretch __________ times per day.  STRENGTHENING EXERCISES - Plantar Fasciitis (Heel Spur Syndrome)  These exercises may help you when beginning to rehabilitate your injury. They may resolve your symptoms with or without further involvement from your physician, physical  therapist or athletic trainer. While completing these exercises, remember:   Muscles can gain both the endurance and the strength needed for everyday activities through controlled exercises.  Complete these exercises as instructed by your physician, physical therapist or athletic trainer. Progress the resistance and repetitions only as guided. STRENGTH - Towel Curls  Sit in a chair positioned on a non-carpeted surface.  Place your foot on a towel, keeping your heel on the floor.  Pull the towel toward your heel by only curling your toes. Keep your heel on the floor.  If instructed by your physician, physical therapist or athletic trainer, add ____________________ at the end of the towel. Repeat __________ times. Complete this exercise __________ times per day. STRENGTH - Ankle Inversion  Secure one end of a rubber exercise band/tubing to a fixed object (table, pole). Loop the other end around your foot just before your toes.  Place your fists between your knees. This will focus your strengthening at your ankle.  Slowly, pull your big toe up and in, making sure the band/tubing is positioned to resist the entire motion.  Hold this position for __________ seconds.  Have your muscles resist the band/tubing as it slowly pulls your foot back to the starting position. Repeat __________ times. Complete this exercises __________ times per day.  Document Released: 03/19/2005 Document Revised: 06/11/2011 Document Reviewed: 07/01/2008 [facility] Patient Information 2014 [place], [place].

## 2013-03-09 NOTE — Progress Notes (Signed)
Subjective:    Patient ID: Theresa Meyers, female    DOB: 1955/12/16, 57 y.o.   MRN: [id number]  HPI  Theresa Meyers is here today to go over her most recent lab results and to discuss the conditions listed below:    1) Type II DM:  She is currently only taking one Janumet XR (50/1000 mg) daily.  She took Invokana for a short period of time but stopped taking it because it made her have urinary frequency.  She works as a Occupational psychologist and it is difficult for her to go to the restroom as frequently as she needed to go on Invokana.  She admits that she has not been watching her diet as closely as she knows she should and she has not been exercising.    2)  Hyperlipidemia:  She was previously on pravastatin.  Her "Link to Wellness" representative recommended that we change her to atorvastatin 40 mg which was done several months ago.  She is doing fine on it.     3)  Foot Pain:  She has a combination of plantar fasciitis and bunions on both feet which limit her ability to exercise.     Review of Systems  Constitutional: Negative for unexpected weight change.  Eyes: Negative for visual disturbance.  Respiratory: Negative for shortness of breath.   Cardiovascular: Negative for chest pain and leg swelling.  Gastrointestinal: Negative for diarrhea.  Endocrine: Negative for polydipsia, polyphagia and polyuria.  Genitourinary: Positive for frequency (when she was on Invokana).  Musculoskeletal: Negative for myalgias.       Pain in feet (plantar fasciitis/bunions)   All other systems reviewed and are negative.    Allergies, Past Medical History, Past Surgical History, Family History, Social History and Medications were reviewed and updated at today's visit.       Objective:   Physical Exam  Vitals reviewed. Constitutional: She appears well-nourished. No distress.  HENT:  Head: Normocephalic.  Eyes: No scleral icterus.  Neck: No thyromegaly present.  Cardiovascular: Normal  rate, regular rhythm and normal heart sounds.   Pulmonary/Chest: Effort normal and breath sounds normal.  Abdominal: There is no tenderness.  Musculoskeletal: She exhibits no edema and no tenderness.  Neurological: She is alert.  Skin: Skin is warm and dry.  Psychiatric: She has a normal mood and affect. Her behavior is normal. Judgment and thought content normal.      Assessment & Plan:    Theresa Meyers was seen today for medication management.  Diagnoses and associated orders for this visit:  Type II or unspecified type diabetes mellitus without mention of complication, uncontrolled Comments: Her A1c is elevated at 7%.  She is to work harder on diet and exercise. She is going to increase the Janumet to 2 at night and will try the Invokana again. She'll first  try to take it in later in the day.  If she continues to have frequent urination, she may break up the dosage and take it 150 mg BID.  She is going to look into taking a spin and/or Zumba class  at [facility].    - SitaGLIPtin-MetFORMIN HCl 50-1000 MG TB24; Take 1 tablet by mouth 2 (two) times daily. - Canagliflozin (INVOKANA) 300 MG TABS; Take 1 tablet (300 mg total) by mouth daily.  Other and unspecified hyperlipidemia Comments: We'll recheck a lipid panel in 3 months.    - atorvastatin (LIPITOR) 40 MG tablet; Take 1 tablet (40 mg total) by mouth daily.  Bunion, unspecified  laterality Comments: She is to follow up with Dr. Ralene Cork for her bunions and plantar fasciitis.    Plantar fasciitis, bilateral Comments: See above.    TIME SPENT "FACE TO FACE" WITH PATIENT -  30 MINS

## 2013-03-11 ENCOUNTER — Encounter: Payer: Self-pay | Admitting: *Deleted

## 2013-03-11 LAB — HM DIABETES EYE EXAM

## 2013-03-13 ENCOUNTER — Ambulatory Visit: Payer: 59

## 2013-04-13 NOTE — Progress Notes (Signed)
Patient ID: Theresa Meyers, female   DOB: [birth year], 58 y.o.   MRN: [id number] ATTENDING PHYSICIAN NOTE: I have reviewed the chart and agree with the plan as detailed above. Dorcas Mcmurray MD Pager [phone]

## 2013-06-08 ENCOUNTER — Ambulatory Visit: Payer: 59 | Admitting: Family Medicine

## 2013-09-23 ENCOUNTER — Other Ambulatory Visit: Payer: Self-pay | Admitting: Family Medicine

## 2013-09-25 ENCOUNTER — Other Ambulatory Visit: Payer: Self-pay | Admitting: *Deleted

## 2013-09-25 DIAGNOSIS — E785 Hyperlipidemia, unspecified: Secondary | ICD-10-CM

## 2013-09-25 MED ORDER — ATORVASTATIN CALCIUM 40 MG PO TABS: 40.0000 mg | ORAL_TABLET | Freq: Every day | ORAL | Status: DC

## 2013-09-25 NOTE — Telephone Encounter (Signed)
Estoria called to check on her refills that the pharmacy sent. She is completely out.

## 2013-10-27 ENCOUNTER — Telehealth: Payer: Self-pay

## 2013-10-27 ENCOUNTER — Other Ambulatory Visit: Payer: Self-pay | Admitting: Family Medicine

## 2013-10-27 DIAGNOSIS — E785 Hyperlipidemia, unspecified: Secondary | ICD-10-CM

## 2013-10-27 MED ORDER — ATORVASTATIN CALCIUM 40 MG PO TABS: 40.0000 mg | ORAL_TABLET | Freq: Every day | ORAL | Status: DC

## 2013-10-27 NOTE — Telephone Encounter (Signed)
Kimala called and she is out of her cholesterol med and she wants to go to [facility] on Monday or Tuesday her appt with Dr Dion Saucier is Aug 7th

## 2013-11-02 ENCOUNTER — Other Ambulatory Visit: Payer: Self-pay | Admitting: *Deleted

## 2013-11-02 DIAGNOSIS — I10 Essential (primary) hypertension: Secondary | ICD-10-CM

## 2013-11-02 DIAGNOSIS — E1165 Type 2 diabetes mellitus with hyperglycemia: Secondary | ICD-10-CM

## 2013-11-02 DIAGNOSIS — IMO0001 Reserved for inherently not codable concepts without codable children: Secondary | ICD-10-CM

## 2013-11-02 DIAGNOSIS — E785 Hyperlipidemia, unspecified: Secondary | ICD-10-CM

## 2013-11-03 LAB — COMPLETE METABOLIC PANEL WITH GFR
ALT: 15 U/L (ref 0–35)
AST: 16 U/L (ref 0–37)
Albumin: 3.6 g/dL (ref 3.5–5.2)
Alkaline Phosphatase: 63 U/L (ref 39–117)
BUN: 9 mg/dL (ref 6–23)
CO2: 27 mEq/L (ref 19–32)
Calcium: 8.6 mg/dL (ref 8.4–10.5)
Chloride: 108 mEq/L (ref 96–112)
Creat: 0.72 mg/dL (ref 0.50–1.10)
GFR, Est African American: >89 mL/min
GFR, Est Non African American: >89 mL/min
Glucose, Bld: 117 mg/dL — ABNORMAL HIGH (ref 70–99)
Potassium: 4.5 mEq/L (ref 3.5–5.3)
Sodium: 141 mEq/L (ref 135–145)
Total Bilirubin: 0.4 mg/dL (ref 0.2–1.2)
Total Protein: 6.7 g/dL (ref 6.0–8.3)

## 2013-11-03 LAB — HEMOGLOBIN A1C
Hgb A1c MFr Bld: 6.8 % — ABNORMAL HIGH (ref <5.7)
Mean Plasma Glucose: 148 mg/dL — ABNORMAL HIGH (ref <117)

## 2013-11-03 LAB — LIPID PANEL
Cholesterol: 157 mg/dL (ref 0–200)
HDL: 54 mg/dL (ref >39)
LDL Cholesterol: 93 mg/dL (ref 0–99)
Total CHOL/HDL Ratio: 2.9 Ratio
Triglycerides: 48 mg/dL (ref <150)
VLDL: 10 mg/dL (ref 0–40)

## 2013-11-06 ENCOUNTER — Encounter: Payer: Self-pay | Admitting: Family Medicine

## 2013-11-06 ENCOUNTER — Ambulatory Visit (INDEPENDENT_AMBULATORY_CARE_PROVIDER_SITE_OTHER): Payer: 59 | Admitting: Family Medicine

## 2013-11-06 VITALS — BP 120/75 | HR 72 | Resp 16 | Ht 62.5 in | Wt 197.0 lb

## 2013-11-06 DIAGNOSIS — E785 Hyperlipidemia, unspecified: Secondary | ICD-10-CM

## 2013-11-06 DIAGNOSIS — E119 Type 2 diabetes mellitus without complications: Secondary | ICD-10-CM

## 2013-11-06 DIAGNOSIS — Z23 Encounter for immunization: Secondary | ICD-10-CM

## 2013-11-06 MED ORDER — GLUCOSE BLOOD VI STRP: ORAL_STRIP | Status: AC

## 2013-11-06 MED ORDER — ATORVASTATIN CALCIUM 40 MG PO TABS
40.0000 mg | ORAL_TABLET | Freq: Every day | ORAL | Status: DC
Start: 2013-11-06 — End: 2021-10-03

## 2013-11-06 MED ORDER — SITAGLIP PHOS-METFORMIN HCL ER 50-1000 MG PO TB24: 1.0000 | ORAL_TABLET | Freq: Two times a day (BID) | ORAL | Status: AC

## 2013-11-06 MED ORDER — TRUEPLUS LANCETS 30G MISC: Status: AC

## 2013-11-06 MED ORDER — CANAGLIFLOZIN 300 MG PO TABS: 1.0000 | ORAL_TABLET | Freq: Every day | ORAL | Status: AC

## 2013-11-06 NOTE — Progress Notes (Signed)
Subjective:    Patient ID: Theresa Meyers, female    DOB: [birth year], 58 y.o.   MRN: [id number]  HPI  Theresa Meyers is here today to follow up on her recent lab results. She is needing medication refills. We are going over the following issues:  1)  Type II DM:  She is taking the Janumet and Invokaka. She admits to not taking the Invokana regularly. Her A1c is 6.8.   2)  Hyperlipidemia:  She is doing well on the Lipitor. Her cholesterol looks great.    Review of Systems  Constitutional: Negative for activity change, appetite change and fatigue.  Cardiovascular: Negative for chest pain, palpitations and leg swelling.  Endocrine: Negative for polydipsia, polyphagia and polyuria.  Psychiatric/Behavioral: Negative for behavioral problems. The patient is not nervous/anxious.   All other systems reviewed and are negative.    Past Medical History  Diagnosis Date  . Diabetes mellitus   . Hypercholesteremia   . Vitamin D deficiency   . Colon polyp   . Anemia   . Discoid lupus     Dr. Abner Greenspan     Past Surgical History  Procedure Laterality Date  . Carpal tunnel release    . Foot surgery       History   Social History Narrative   Marital Status:  Married Health and safety inspector)   Children:  Son Enid Derry) 48; Daughter (Chanell) [phone].   Pets:  None   Living Situation: Lives with spouse and children.    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 Diabetes Mother   . Hypertension Mother   . Diabetes Father   . Hypertension Father   . Cancer Father     Colon  . Rheum arthritis Sister   . Colon polyps Sister      Current Outpatient Prescriptions on File Prior to Visit  Medication Sig Dispense Refill  . SitaGLIPtin-MetFORMIN HCl 50-1000  MG TB24 Take 1 tablet by mouth 2 (two) times daily.  180 tablet  5   No current facility-administered medications on file prior to visit.     No Known Allergies   Immunization History  Administered Date(s) Administered  . Pneumococcal Polysaccharide-23 11/06/2013       Objective:   Physical Exam  Vitals reviewed. Constitutional: She appears well-nourished. No distress.  HENT:  Head: Normocephalic.  Eyes: No scleral icterus.  Neck: No thyromegaly present.  Cardiovascular: Normal rate, regular rhythm and normal heart sounds.   Pulmonary/Chest: Effort normal and breath sounds normal.  Abdominal: There is no tenderness.  Musculoskeletal: She exhibits no edema and no tenderness.  Neurological: She is alert.  Skin: Skin is warm and dry.  Psychiatric: She has a normal mood and affect. Her behavior is normal. Judgment and thought content normal.       Assessment & Plan:    Theresa Meyers was seen today for medication management.  Diagnoses and associated orders for this visit:  Other and unspecified hyperlipidemia Comments: Her lipid panel is perfect on her current dosage of Lipitor sho she will remain on it.   - atorvastatin (LIPITOR) 40 MG tablet; Take 1  tablet (40 mg total) by mouth daily.  Type II or unspecified type diabetes mellitus without mention of complication, not stated as uncontrolled Comments: She will remain on her current medications and will try to work harder on her diet and exercise and will take her meds on a more regular basis.   - glucose blood (TRUETEST TEST) test strip; Check sugar daily - TRUEPLUS LANCETS 30G MISC; Use one lancet daily - SitaGLIPtin-MetFORMIN HCl (JANUMET XR) 50-1000 MG TB24; Take 1 tablet by mouth 2 (two) times daily. - Canagliflozin (INVOKANA) 300 MG TABS; Take 1 tablet (300 mg total) by mouth daily.  Need for prophylactic vaccination against Streptococcus pneumoniae (pneumococcus) - Pneumococcal polysaccharide vaccine 23-valent greater  than or equal to 2yo subcutaneous/IM

## 2014-03-23 ENCOUNTER — Other Ambulatory Visit (HOSPITAL_COMMUNITY): Payer: Self-pay | Admitting: Obstetrics and Gynecology

## 2014-03-23 DIAGNOSIS — Z1231 Encounter for screening mammogram for malignant neoplasm of breast: Secondary | ICD-10-CM

## 2014-03-31 ENCOUNTER — Ambulatory Visit (HOSPITAL_COMMUNITY)
Admission: RE | Admit: 2014-03-31 | Discharge: 2014-03-31 | Disposition: A | Discharge status: Home / Self Care | Payer: 59 | Source: Ambulatory Visit | Attending: Obstetrics and Gynecology | Admitting: Obstetrics and Gynecology

## 2014-03-31 DIAGNOSIS — Z1231 Encounter for screening mammogram for malignant neoplasm of breast: Secondary | ICD-10-CM | POA: Insufficient documentation

## 2015-04-05 MED FILL — JANUMET XR 50-1,000 MG TAB: 50-1000 | 90 days supply | Qty: 90 | Fill #0

## 2015-04-29 MED FILL — ATORVASTATIN 40 MG TABLET: 40 | 90 days supply | Qty: 90 | Fill #0

## 2015-05-17 ENCOUNTER — Other Ambulatory Visit: Payer: Self-pay

## 2015-05-17 DIAGNOSIS — Z1231 Encounter for screening mammogram for malignant neoplasm of breast: Secondary | ICD-10-CM

## 2015-05-26 MED FILL — JANUMET XR 50-1,000 MG TAB: 50-1000 | 90 days supply | Qty: 180 | Fill #0

## 2015-05-31 DIAGNOSIS — Z800 Family history of malignant neoplasm of digestive organs: Secondary | ICD-10-CM | POA: Diagnosis not present

## 2015-05-31 DIAGNOSIS — L219 Seborrheic dermatitis, unspecified: Secondary | ICD-10-CM | POA: Diagnosis not present

## 2015-05-31 DIAGNOSIS — K5900 Constipation, unspecified: Secondary | ICD-10-CM | POA: Diagnosis not present

## 2015-05-31 DIAGNOSIS — L708 Other acne: Secondary | ICD-10-CM | POA: Diagnosis not present

## 2015-05-31 DIAGNOSIS — Z1211 Encounter for screening for malignant neoplasm of colon: Secondary | ICD-10-CM | POA: Diagnosis not present

## 2015-06-07 DIAGNOSIS — E118 Type 2 diabetes mellitus with unspecified complications: Secondary | ICD-10-CM | POA: Diagnosis not present

## 2015-06-07 DIAGNOSIS — E559 Vitamin D deficiency, unspecified: Secondary | ICD-10-CM | POA: Diagnosis not present

## 2015-06-07 DIAGNOSIS — E785 Hyperlipidemia, unspecified: Secondary | ICD-10-CM | POA: Diagnosis not present

## 2015-06-07 MED FILL — FLUOCINONIDE 0.05% SOLUTION: 0.05 | 20 days supply | Qty: 60 | Fill #0

## 2015-06-15 ENCOUNTER — Ambulatory Visit
Admission: RE | Admit: 2015-06-15 | Discharge: 2015-06-15 | Disposition: A | Discharge status: Home / Self Care | Payer: 59 | Source: Ambulatory Visit

## 2015-06-15 DIAGNOSIS — R079 Chest pain, unspecified: Secondary | ICD-10-CM | POA: Diagnosis not present

## 2015-06-15 DIAGNOSIS — Z0000 Encounter for general adult medical examination without abnormal findings: Secondary | ICD-10-CM | POA: Diagnosis not present

## 2015-06-15 DIAGNOSIS — Z1231 Encounter for screening mammogram for malignant neoplasm of breast: Secondary | ICD-10-CM | POA: Diagnosis not present

## 2015-06-15 DIAGNOSIS — E1165 Type 2 diabetes mellitus with hyperglycemia: Secondary | ICD-10-CM | POA: Diagnosis not present

## 2015-08-19 MED FILL — JANUMET XR 50-1,000 MG TAB: 50-1000 | 90 days supply | Qty: 180 | Fill #1

## 2015-08-19 MED FILL — SUPREP BOWEL PREP KIT: 17.5-3.13-1 | 1 days supply | Qty: 354 | Fill #0

## 2015-08-19 MED FILL — FLUOCINONIDE 0.05% SOLUTION: 0.05 | 20 days supply | Qty: 60 | Fill #1

## 2015-08-19 MED FILL — ATORVASTATIN 40 MG TABLET: 40 | 90 days supply | Qty: 90 | Fill #1

## 2015-08-22 DIAGNOSIS — D123 Benign neoplasm of transverse colon: Secondary | ICD-10-CM | POA: Diagnosis not present

## 2015-08-22 DIAGNOSIS — Z800 Family history of malignant neoplasm of digestive organs: Secondary | ICD-10-CM | POA: Diagnosis not present

## 2015-08-22 DIAGNOSIS — Z1211 Encounter for screening for malignant neoplasm of colon: Secondary | ICD-10-CM | POA: Diagnosis not present

## 2015-08-22 DIAGNOSIS — K635 Polyp of colon: Secondary | ICD-10-CM | POA: Diagnosis not present

## 2015-09-15 DIAGNOSIS — M549 Dorsalgia, unspecified: Secondary | ICD-10-CM | POA: Diagnosis not present

## 2015-11-28 MED FILL — JANUMET XR 50-1,000 MG TAB: 50-1000 | 90 days supply | Qty: 180 | Fill #2

## 2015-11-28 MED FILL — ATORVASTATIN 40 MG TABLET: 40 | 90 days supply | Qty: 90 | Fill #2

## 2015-12-16 DIAGNOSIS — E118 Type 2 diabetes mellitus with unspecified complications: Secondary | ICD-10-CM | POA: Diagnosis not present

## 2015-12-23 DIAGNOSIS — E559 Vitamin D deficiency, unspecified: Secondary | ICD-10-CM | POA: Diagnosis not present

## 2015-12-23 DIAGNOSIS — E785 Hyperlipidemia, unspecified: Secondary | ICD-10-CM | POA: Diagnosis not present

## 2015-12-23 DIAGNOSIS — E1165 Type 2 diabetes mellitus with hyperglycemia: Secondary | ICD-10-CM | POA: Diagnosis not present

## 2015-12-23 DIAGNOSIS — R5383 Other fatigue: Secondary | ICD-10-CM | POA: Diagnosis not present

## 2016-02-07 DIAGNOSIS — I8390 Asymptomatic varicose veins of unspecified lower extremity: Secondary | ICD-10-CM | POA: Diagnosis not present

## 2016-02-07 DIAGNOSIS — Z1151 Encounter for screening for human papillomavirus (HPV): Secondary | ICD-10-CM | POA: Diagnosis not present

## 2016-02-07 DIAGNOSIS — Z01419 Encounter for gynecological examination (general) (routine) without abnormal findings: Secondary | ICD-10-CM | POA: Diagnosis not present

## 2016-02-16 ENCOUNTER — Emergency Department (HOSPITAL_BASED_OUTPATIENT_CLINIC_OR_DEPARTMENT_OTHER)
Admission: EM | Admit: 2016-02-16 | Discharge: 2016-02-16 | Disposition: A | Discharge status: Home / Self Care | Payer: 59 | Attending: Emergency Medicine | Admitting: Emergency Medicine

## 2016-02-16 ENCOUNTER — Encounter (HOSPITAL_BASED_OUTPATIENT_CLINIC_OR_DEPARTMENT_OTHER): Payer: Self-pay | Admitting: Emergency Medicine

## 2016-02-16 DIAGNOSIS — Y999 Unspecified external cause status: Secondary | ICD-10-CM | POA: Insufficient documentation

## 2016-02-16 DIAGNOSIS — Z79899 Other long term (current) drug therapy: Secondary | ICD-10-CM | POA: Insufficient documentation

## 2016-02-16 DIAGNOSIS — M542 Cervicalgia: Secondary | ICD-10-CM | POA: Insufficient documentation

## 2016-02-16 DIAGNOSIS — E119 Type 2 diabetes mellitus without complications: Secondary | ICD-10-CM | POA: Insufficient documentation

## 2016-02-16 DIAGNOSIS — Y92410 Unspecified street and highway as the place of occurrence of the external cause: Secondary | ICD-10-CM | POA: Diagnosis not present

## 2016-02-16 DIAGNOSIS — Y939 Activity, unspecified: Secondary | ICD-10-CM | POA: Diagnosis not present

## 2016-02-16 DIAGNOSIS — S199XXA Unspecified injury of neck, initial encounter: Secondary | ICD-10-CM | POA: Diagnosis not present

## 2016-02-16 DIAGNOSIS — M791 Myalgia: Secondary | ICD-10-CM | POA: Diagnosis not present

## 2016-02-16 MED ORDER — IBUPROFEN 800 MG PO TABS: 800.0000 mg | ORAL_TABLET | Freq: Three times a day (TID) | ORAL | 0 refills | Status: DC

## 2016-02-16 MED ORDER — CYCLOBENZAPRINE HCL 10 MG PO TABS: 10.0000 mg | ORAL_TABLET | Freq: Two times a day (BID) | ORAL | 0 refills | Status: DC | PRN
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
## 2016-02-16 NOTE — ED Notes (Signed)
Pt. Reports she feels like she has glass in her face.  Teaching done on showering safely.
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
## 2016-02-22 DIAGNOSIS — L818 Other specified disorders of pigmentation: Secondary | ICD-10-CM | POA: Diagnosis not present

## 2016-02-22 DIAGNOSIS — L80 Vitiligo: Secondary | ICD-10-CM | POA: Diagnosis not present

## 2016-02-22 DIAGNOSIS — L918 Other hypertrophic disorders of the skin: Secondary | ICD-10-CM | POA: Diagnosis not present

## 2016-03-13 MED FILL — ATORVASTATIN 40 MG TABLET: 40 | 90 days supply | Qty: 90 | Fill #3

## 2016-04-11 ENCOUNTER — Other Ambulatory Visit: Payer: Self-pay | Admitting: Obstetrics and Gynecology

## 2016-04-19 MED FILL — JANUMET XR 50-1,000 MG TAB: 50-1000 | 30 days supply | Qty: 60 | Fill #0

## 2016-05-21 MED FILL — JANUMET XR 50-1,000 MG TAB: 50-1000 | 30 days supply | Qty: 60 | Fill #1

## 2016-06-08 DIAGNOSIS — R5383 Other fatigue: Secondary | ICD-10-CM | POA: Diagnosis not present

## 2016-06-08 DIAGNOSIS — E785 Hyperlipidemia, unspecified: Secondary | ICD-10-CM | POA: Diagnosis not present

## 2016-06-08 DIAGNOSIS — E1165 Type 2 diabetes mellitus with hyperglycemia: Secondary | ICD-10-CM | POA: Diagnosis not present

## 2016-06-15 DIAGNOSIS — M79642 Pain in left hand: Secondary | ICD-10-CM | POA: Diagnosis not present

## 2016-06-15 DIAGNOSIS — L309 Dermatitis, unspecified: Secondary | ICD-10-CM | POA: Diagnosis not present

## 2016-06-15 DIAGNOSIS — M79641 Pain in right hand: Secondary | ICD-10-CM | POA: Diagnosis not present

## 2016-06-15 DIAGNOSIS — E1165 Type 2 diabetes mellitus with hyperglycemia: Secondary | ICD-10-CM | POA: Diagnosis not present

## 2016-06-15 DIAGNOSIS — R110 Nausea: Secondary | ICD-10-CM | POA: Diagnosis not present

## 2016-06-15 DIAGNOSIS — M21619 Bunion of unspecified foot: Secondary | ICD-10-CM | POA: Diagnosis not present

## 2016-06-15 MED FILL — METFORMIN HCL ER 500 MG TAB: 500 | 90 days supply | Qty: 360 | Fill #0

## 2016-06-15 MED FILL — PROMETHAZINE 12.5 MG TABLET: 12.5 | 15 days supply | Qty: 60 | Fill #0

## 2016-06-15 MED FILL — VIT D2 1.25 MG (50,000 UNIT: 1.25 MG | 84 days supply | Qty: 24 | Fill #0

## 2016-06-15 MED FILL — ATORVASTATIN 40 MG TABLET: 40 | 90 days supply | Qty: 90 | Fill #0

## 2016-07-06 DIAGNOSIS — H524 Presbyopia: Secondary | ICD-10-CM | POA: Diagnosis not present

## 2016-07-06 DIAGNOSIS — H52223 Regular astigmatism, bilateral: Secondary | ICD-10-CM | POA: Diagnosis not present

## 2016-07-06 DIAGNOSIS — H2513 Age-related nuclear cataract, bilateral: Secondary | ICD-10-CM | POA: Diagnosis not present

## 2016-07-17 DIAGNOSIS — M25531 Pain in right wrist: Secondary | ICD-10-CM | POA: Diagnosis not present

## 2016-07-17 MED FILL — MELOXICAM 15 MG TABLET: 15 | 30 days supply | Qty: 30 | Fill #0

## 2016-07-27 MED FILL — TRULICITY 1.5 MG/0.5 ML PEN: 1.5 | 28 days supply | Qty: 2 | Fill #0

## 2016-08-24 MED FILL — TRULICITY 1.5 MG/0.5 ML PEN: 1.5 | 28 days supply | Qty: 2 | Fill #1

## 2016-08-29 DIAGNOSIS — M25531 Pain in right wrist: Secondary | ICD-10-CM | POA: Diagnosis not present

## 2016-09-05 ENCOUNTER — Other Ambulatory Visit: Payer: Self-pay | Admitting: Family Medicine

## 2016-09-05 DIAGNOSIS — Z1231 Encounter for screening mammogram for malignant neoplasm of breast: Secondary | ICD-10-CM

## 2016-09-14 ENCOUNTER — Ambulatory Visit
Admission: RE | Admit: 2016-09-14 | Discharge: 2016-09-14 | Disposition: A | Discharge status: Home / Self Care | Payer: 59 | Source: Ambulatory Visit | Attending: Family Medicine | Admitting: Family Medicine

## 2016-09-14 DIAGNOSIS — Z1231 Encounter for screening mammogram for malignant neoplasm of breast: Secondary | ICD-10-CM | POA: Diagnosis not present

## 2016-09-14 MED FILL — ATORVASTATIN 40 MG TABLET: 40 | 90 days supply | Qty: 90 | Fill #1

## 2016-09-14 MED FILL — METFORMIN HCL ER 500 MG TAB: 500 | 90 days supply | Qty: 360 | Fill #1

## 2016-09-27 MED FILL — TRULICITY 1.5 MG/0.5 ML PEN: 1.5 | 28 days supply | Qty: 2 | Fill #2

## 2016-10-16 DIAGNOSIS — M2041 Other hammer toe(s) (acquired), right foot: Secondary | ICD-10-CM | POA: Diagnosis not present

## 2016-10-16 DIAGNOSIS — M2042 Other hammer toe(s) (acquired), left foot: Secondary | ICD-10-CM | POA: Diagnosis not present

## 2016-10-16 DIAGNOSIS — M2011 Hallux valgus (acquired), right foot: Secondary | ICD-10-CM | POA: Diagnosis not present

## 2016-10-16 DIAGNOSIS — L84 Corns and callosities: Secondary | ICD-10-CM | POA: Diagnosis not present

## 2016-10-16 DIAGNOSIS — M2012 Hallux valgus (acquired), left foot: Secondary | ICD-10-CM | POA: Diagnosis not present

## 2016-10-17 DIAGNOSIS — M2012 Hallux valgus (acquired), left foot: Secondary | ICD-10-CM | POA: Insufficient documentation

## 2016-10-17 DIAGNOSIS — M2041 Other hammer toe(s) (acquired), right foot: Secondary | ICD-10-CM | POA: Insufficient documentation

## 2016-10-17 DIAGNOSIS — M2042 Other hammer toe(s) (acquired), left foot: Secondary | ICD-10-CM | POA: Insufficient documentation

## 2016-10-17 DIAGNOSIS — M2011 Hallux valgus (acquired), right foot: Secondary | ICD-10-CM | POA: Insufficient documentation

## 2016-10-26 MED FILL — TRULICITY 1.5 MG/0.5 ML PEN: 1.5 | 28 days supply | Qty: 2 | Fill #3

## 2016-11-23 MED FILL — TRULICITY 1.5 MG/0.5 ML PEN: 1.5 | 28 days supply | Qty: 2 | Fill #4

## 2016-12-07 DIAGNOSIS — Z794 Long term (current) use of insulin: Secondary | ICD-10-CM | POA: Diagnosis not present

## 2016-12-07 DIAGNOSIS — E119 Type 2 diabetes mellitus without complications: Secondary | ICD-10-CM | POA: Diagnosis not present

## 2016-12-07 DIAGNOSIS — I10 Essential (primary) hypertension: Secondary | ICD-10-CM | POA: Diagnosis not present

## 2016-12-19 DIAGNOSIS — E119 Type 2 diabetes mellitus without complications: Secondary | ICD-10-CM | POA: Diagnosis not present

## 2016-12-19 DIAGNOSIS — E785 Hyperlipidemia, unspecified: Secondary | ICD-10-CM | POA: Diagnosis not present

## 2016-12-19 MED FILL — ATORVASTATIN 40 MG TABLET: 40 | 90 days supply | Qty: 90 | Fill #0

## 2016-12-19 MED FILL — TRULICITY 1.5 MG/0.5 ML PEN: 1.5 | 28 days supply | Qty: 2 | Fill #0

## 2016-12-27 DIAGNOSIS — M2041 Other hammer toe(s) (acquired), right foot: Secondary | ICD-10-CM | POA: Diagnosis not present

## 2016-12-27 DIAGNOSIS — M2011 Hallux valgus (acquired), right foot: Secondary | ICD-10-CM | POA: Diagnosis not present

## 2016-12-27 DIAGNOSIS — L84 Corns and callosities: Secondary | ICD-10-CM | POA: Diagnosis not present

## 2016-12-27 DIAGNOSIS — R269 Unspecified abnormalities of gait and mobility: Secondary | ICD-10-CM | POA: Diagnosis not present

## 2016-12-27 DIAGNOSIS — M2021 Hallux rigidus, right foot: Secondary | ICD-10-CM | POA: Diagnosis not present

## 2016-12-27 MED FILL — IBUPROFEN 800 MG TABS: 800 | 17 days supply | Qty: 50 | Fill #0

## 2016-12-28 DIAGNOSIS — M21611 Bunion of right foot: Secondary | ICD-10-CM | POA: Diagnosis not present

## 2016-12-28 DIAGNOSIS — E1165 Type 2 diabetes mellitus with hyperglycemia: Secondary | ICD-10-CM | POA: Diagnosis not present

## 2016-12-28 DIAGNOSIS — Z01818 Encounter for other preprocedural examination: Secondary | ICD-10-CM | POA: Diagnosis not present

## 2016-12-28 MED FILL — HYDROCODON-APAP 5-325: 5-325 | 6 days supply | Qty: 40 | Fill #0

## 2017-01-04 DIAGNOSIS — E119 Type 2 diabetes mellitus without complications: Secondary | ICD-10-CM | POA: Diagnosis not present

## 2017-01-04 DIAGNOSIS — Z7984 Long term (current) use of oral hypoglycemic drugs: Secondary | ICD-10-CM | POA: Diagnosis not present

## 2017-01-04 DIAGNOSIS — M21611 Bunion of right foot: Secondary | ICD-10-CM | POA: Diagnosis not present

## 2017-01-04 DIAGNOSIS — Z6833 Body mass index (BMI) 33.0-33.9, adult: Secondary | ICD-10-CM | POA: Diagnosis not present

## 2017-01-04 DIAGNOSIS — M7751 Other enthesopathy of right foot: Secondary | ICD-10-CM | POA: Diagnosis not present

## 2017-01-04 DIAGNOSIS — E785 Hyperlipidemia, unspecified: Secondary | ICD-10-CM | POA: Diagnosis not present

## 2017-01-04 DIAGNOSIS — M2041 Other hammer toe(s) (acquired), right foot: Secondary | ICD-10-CM | POA: Diagnosis not present

## 2017-01-04 DIAGNOSIS — M19071 Primary osteoarthritis, right ankle and foot: Secondary | ICD-10-CM | POA: Diagnosis not present

## 2017-01-04 DIAGNOSIS — E669 Obesity, unspecified: Secondary | ICD-10-CM | POA: Diagnosis not present

## 2017-01-18 MED FILL — TRULICITY 1.5 MG/0.5 ML PEN: 1.5 | 28 days supply | Qty: 2 | Fill #1

## 2017-02-07 DIAGNOSIS — Z96698 Presence of other orthopedic joint implants: Secondary | ICD-10-CM | POA: Diagnosis not present

## 2017-02-07 DIAGNOSIS — Z4789 Encounter for other orthopedic aftercare: Secondary | ICD-10-CM | POA: Diagnosis not present

## 2017-02-07 DIAGNOSIS — M2011 Hallux valgus (acquired), right foot: Secondary | ICD-10-CM | POA: Diagnosis not present

## 2017-02-07 DIAGNOSIS — M2041 Other hammer toe(s) (acquired), right foot: Secondary | ICD-10-CM | POA: Diagnosis not present

## 2017-02-15 MED FILL — TRULICITY 1.5 MG/0.5 ML PEN: 1.5 | 28 days supply | Qty: 2 | Fill #2

## 2017-02-15 MED FILL — SYNJARDY XR 12.5-1,000 MG T: 12.5-1000 | 30 days supply | Qty: 60 | Fill #0

## 2017-02-28 DIAGNOSIS — M2041 Other hammer toe(s) (acquired), right foot: Secondary | ICD-10-CM | POA: Diagnosis not present

## 2017-02-28 DIAGNOSIS — M2011 Hallux valgus (acquired), right foot: Secondary | ICD-10-CM | POA: Diagnosis not present

## 2017-03-15 MED FILL — TRULICITY 1.5 MG/0.5 ML PEN: 1.5 | 28 days supply | Qty: 2 | Fill #3

## 2017-03-21 DIAGNOSIS — M2041 Other hammer toe(s) (acquired), right foot: Secondary | ICD-10-CM | POA: Diagnosis not present

## 2017-03-21 DIAGNOSIS — M2011 Hallux valgus (acquired), right foot: Secondary | ICD-10-CM | POA: Diagnosis not present

## 2017-04-12 MED FILL — TRULICITY 1.5 MG/0.5 ML PEN: 1.5 | 28 days supply | Qty: 2 | Fill #4

## 2017-04-12 MED FILL — ATORVASTATIN 40 MG TABLET: 40 | 90 days supply | Qty: 90 | Fill #1

## 2017-04-16 MED FILL — SYNJARDY XR 12.5-1,000 MG T: 12.5-1000 | 30 days supply | Qty: 60 | Fill #1

## 2017-05-13 MED FILL — TRULICITY 1.5 MG/0.5 ML PEN: 1.5 | 28 days supply | Qty: 2 | Fill #5

## 2017-06-14 DIAGNOSIS — E119 Type 2 diabetes mellitus without complications: Secondary | ICD-10-CM | POA: Diagnosis not present

## 2017-06-14 DIAGNOSIS — R5383 Other fatigue: Secondary | ICD-10-CM | POA: Diagnosis not present

## 2017-06-14 DIAGNOSIS — Z1159 Encounter for screening for other viral diseases: Secondary | ICD-10-CM | POA: Diagnosis not present

## 2017-06-14 MED FILL — SYNJARDY XR 12.5-1,000 MG T: 12.5-1000 | 30 days supply | Qty: 60 | Fill #2

## 2017-06-14 MED FILL — TRULICITY 1.5 MG/0.5 ML PEN: 1.5 | 28 days supply | Qty: 2 | Fill #5

## 2017-06-21 DIAGNOSIS — E119 Type 2 diabetes mellitus without complications: Secondary | ICD-10-CM | POA: Diagnosis not present

## 2017-06-21 DIAGNOSIS — E669 Obesity, unspecified: Secondary | ICD-10-CM | POA: Diagnosis not present

## 2017-06-21 DIAGNOSIS — E785 Hyperlipidemia, unspecified: Secondary | ICD-10-CM | POA: Diagnosis not present

## 2017-07-02 MED FILL — ATORVASTATIN 40 MG TABLET: 40 | 90 days supply | Qty: 90 | Fill #2

## 2017-07-10 ENCOUNTER — Ambulatory Visit (INDEPENDENT_AMBULATORY_CARE_PROVIDER_SITE_OTHER): Payer: Self-pay | Admitting: Nurse Practitioner

## 2017-07-10 ENCOUNTER — Encounter: Payer: Self-pay | Admitting: Nurse Practitioner

## 2017-07-10 VITALS — BP 118/82 | HR 72 | Temp 98.2°F | Resp 16 | Wt 190.4 lb

## 2017-07-10 DIAGNOSIS — L508 Other urticaria: Secondary | ICD-10-CM

## 2017-07-10 MED ORDER — HYDROXYZINE HCL 10 MG PO TABS: 10.0000 mg | ORAL_TABLET | Freq: Three times a day (TID) | ORAL | 0 refills | Status: AC | PRN

## 2017-07-10 MED ORDER — HYDROCORTISONE 2.5 % EX CREA
TOPICAL_CREAM | Freq: Two times a day (BID) | CUTANEOUS | 0 refills | Status: AC
Start: 2017-07-10 — End: 2017-07-20

## 2017-07-10 MED ORDER — LORATADINE 10 MG PO TABS: 10.0000 mg | ORAL_TABLET | Freq: Every day | ORAL | 0 refills | Status: DC

## 2017-07-10 MED FILL — HYDROCORTISONE 2.5% CREAM: 2.5 | 10 days supply | Qty: 30 | Fill #0

## 2017-07-10 MED FILL — hydrOXYzine HCL 10 MG TABS: 10 | 10 days supply | Qty: 30 | Fill #0

## 2017-07-10 NOTE — Progress Notes (Signed)
Subjective:     Theresa Meyers is a 62 y.o. female who presents for evaluation of a rash involving the upper body and upper extremity. Rash started 7 days ago. Lesions are erythematous, and raised in texture. Rash has changed over time. Rash is pruritic. Associated symptoms: none. Patient denies: abdominal pain, congestion, cough, decrease in appetite, fever, headache, myalgia, sore throat and vomiting. Patient has not had contacts with similar rash. Patient has not had new exposures (soaps, lotions, laundry detergents, foods, medications, plants, insects or animals). Patient is a diabetic with last A1C- 6.8.  The following portions of the patient's history were reviewed and updated as appropriate: allergies, current medications and past medical history.  Review of Systems Constitutional: negative Eyes: negative Ears, nose, mouth, throat, and face: negative Respiratory: negative Cardiovascular: negative Integument/breast: positive for pruritus, rash and skin color change, negative for dryness and skin lesion(s) Allergic/Immunologic: negative    Objective:    BP 118/82 (BP Location: Right Arm, Patient Position: Sitting, Cuff Size: Normal)   Pulse 72   Temp 98.2 F (36.8 C) (Oral)   Resp 16   Wt 190 lb 6.4 oz (86.4 kg)   SpO2 99%   BMI 33.73 kg/m  General:  alert, cooperative and no distress  Skin:  erythema noted on  abdomen, bilateral upper extremities, shoulders and cervical region and wheals to abdomen, bilateral upper extremities, bilateral shoulders and neck around base of hairline, raised with erythematous base, no drainage     Assessment:    Urticaria    Plan:    Medications: hydrocortisone and Claritin and Hydroxyzine. Written patient instruction given. Patient instructions to avoid scratching.  Patient will purchase Aveeno Colloidal Oatmeal bath for comfort.  Patient will avoid hot showers or baths and instructed to use cool compresses if needed.  Patient instructed that  if symptoms do not improve, will need to follow up with PCP for further workup.  Patient verbalizes understanding and had no questions at time of discharge. Meds ordered this encounter  Medications  . loratadine (CLARITIN) 10 MG tablet    Sig: Take 1 tablet (10 mg total) by mouth daily.    Dispense:  30 tablet    Refill:  0    Order Specific Question:   Supervising Provider    Answer:   Ricard Dillon [[id number]]  . hydrOXYzine (ATARAX/VISTARIL) 10 MG tablet    Sig: Take 1 tablet (10 mg total) by mouth 3 (three) times daily as needed for up to 10 days.    Dispense:  30 tablet    Refill:  0    Order Specific Question:   Supervising Provider    Answer:   Ricard Dillon [[id number]]  . hydrocortisone 2.5 % cream    Sig: Apply topically 2 (two) times daily for 10 days. Apply to affected areas twice daily as needed for itching.    Dispense:  20 g    Refill:  0    Order Specific Question:   Supervising Provider    Answer:   Ricard Dillon [phone]

## 2017-07-10 NOTE — Patient Instructions (Signed)
Hives Hives (urticaria) are itchy, red, swollen areas on your skin. Hives can appear on any part of your body and can vary in size. They can be as small as the tip of a pen or much larger. Hives often fade within 24 hours (acute hives). In other cases, new hives appear after old ones fade. This cycle can continue for several days or weeks (chronic hives). Hives result from your body's reaction to an irritant or to something that you are allergic to (trigger). When you are exposed to a trigger, your body releases a chemical (histamine) that causes redness, itching, and swelling. You can get hives immediately after being exposed to a trigger or hours later. Hives do not spread from person to person (are not contagious). Your hives may get worse with scratching, exercise, and emotional stress. What are the causes? Causes of this condition include:  Allergies to certain foods or ingredients.  Insect bites or stings.  Exposure to pollen or pet dander.  Contact with latex or chemicals.  Spending time in sunlight, heat, or cold (exposure).  Exercise.  Stress.  You can also get hives from some medical conditions and treatments. These include:  Viruses, including the common cold.  Bacterial infections, such as urinary tract infections and strep throat.  Disorders such as vasculitis, lupus, or thyroid disease.  Certain medications.  Allergy shots.  Blood transfusions.  Sometimes, the cause of hives is not known (idiopathic hives). What increases the risk? This condition is more likely to develop in:  Women.  People who have food allergies, especially to citrus fruits, milk, eggs, peanuts, tree nuts, or shellfish.  People who are allergic to: ? Medicines. ? Latex. ? Insects. ? Animals. ? Pollen.  People who have certain medical conditions, includinglupus or thyroid disease.  What are the signs or symptoms? The main symptom of this condition is raised, itchyred or white  bumps or patches on your skin. These areas may:  Become large and swollen (welts).  Change in shape and location, quickly and repeatedly.  Be separate hives or connect over a large area of skin.  Sting or become painful.  Turn white when pressed in the center (blanch).  In severe cases, yourhands, feet, and face may also become swollen. This may occur if hives develop deeper in your skin. How is this diagnosed? This condition is diagnosed based on your symptoms, medical history, and physical exam. Your skin, urine, or blood may be tested to find out what is causing your hives and to rule out other health issues. Your health care provider may also remove a small sample of skin from the affected area and examine it under a microscope (biopsy). How is this treated? Treatment depends on the severity of your condition. Your health care provider may recommend using cool, wet cloths (cool compresses) or taking cool showers to relieve itching. Hives are sometimes treated with medicines, including:  Antihistamines.  Corticosteroids.  Antibiotics.  An injectable medicine (omalizumab). Your health care provider may prescribe this if you have chronic idiopathic hives and you continue to have symptoms even after treatment with antihistamines.  Severe cases may require an emergency injection of adrenaline (epinephrine) to prevent a life-threatening allergic reaction (anaphylaxis). Follow these instructions at home: Medicines  Take or apply over-the-counter and prescription medicines only as told by your health care provider.  If you were prescribed an antibiotic medicine, use it as told by your health care provider. Do not stop taking the antibiotic even if you start  to feel better. Skin Care  Apply cool compresses to the affected areas.  Do not scratch or rub your skin.  Aveeno Colloidal oatmeal baths as needed.  General instructions  Do not take hot showers or baths. This can make  itching worse.  Do not wear tight-fitting clothing.  Use sunscreen and wear protective clothing when you are outside.  Avoid any substances that cause your hives. Keep a journal to help you track what causes your hives. Write down: ? What medicines you take. ? What you eat and drink. ? What products you use on your skin.  Keep all follow-up visits as told by your health care provider. This is important. Contact a health care provider if:  Your symptoms are not controlled with medicine.  Your joints are painful or swollen. Get help right away if:  You have a fever.  You have pain in your abdomen.  Your tongue or lips are swollen.  Your eyelids are swollen.  Your chest or throat feels tight.  You have trouble breathing or swallowing. These symptoms may represent a serious problem that is an emergency. Do not wait to see if the symptoms will go away. Get medical help right away. Call your local emergency services (911 in the [place] Do not drive yourself to the hospital. This information is not intended to replace advice given to you by your health care provider. Make sure you discuss any questions you have with your health care provider. Document Released: 03/19/2005 Document Revised: 08/17/2015 Document Reviewed: 01/05/2015 Elsevier Interactive Patient Education  2018 [employer].

## 2017-07-11 DIAGNOSIS — L509 Urticaria, unspecified: Secondary | ICD-10-CM | POA: Diagnosis not present

## 2017-07-11 DIAGNOSIS — T7840XA Allergy, unspecified, initial encounter: Secondary | ICD-10-CM | POA: Diagnosis not present

## 2017-07-11 MED FILL — FREESTYLE LITE METER: 30 days supply | Qty: 1 | Fill #0

## 2017-07-11 MED FILL — FREESTYLE LITE TEST STRIP: 50 days supply | Qty: 50 | Fill #0

## 2017-07-11 MED FILL — FREESTYLE LANCETS: 90 days supply | Qty: 100 | Fill #0

## 2017-07-11 MED FILL — predniSONE 20 MG TABS: 20 | 5 days supply | Qty: 10 | Fill #0

## 2017-07-11 MED FILL — OMEPRAZOLE 20 MG CAP: 20 | 15 days supply | Qty: 30 | Fill #0

## 2017-07-15 MED FILL — TRULICITY 1.5 MG/0.5 ML PEN: 1.5 | 28 days supply | Qty: 2 | Fill #0

## 2017-07-16 DIAGNOSIS — L509 Urticaria, unspecified: Secondary | ICD-10-CM | POA: Diagnosis not present

## 2017-07-16 MED FILL — raNITIdine HCL 150 MG TABS: 150 | 7 days supply | Qty: 14 | Fill #0

## 2017-07-16 MED FILL — SM FEXOFENADINE HCL 60 MG: 60 MG | 8 days supply | Qty: 24 | Fill #0

## 2017-07-16 MED FILL — predniSONE 10 MG TABS: 10 | 6 days supply | Qty: 21 | Fill #0

## 2017-07-17 MED FILL — CLOBETASOL PROP 0.05% FOAM: 0.05 | 60 days supply | Qty: 100 | Fill #0

## 2017-07-18 MED FILL — HUMALOG 100 UNITS/ML KWIKPE: 100 | 30 days supply | Qty: 15 | Fill #0

## 2017-07-19 MED FILL — PENTIPS 31G X 5 MM MISC: 31G X 5 MM | 90 days supply | Qty: 100 | Fill #0

## 2017-07-23 DIAGNOSIS — Z7689 Persons encountering health services in other specified circumstances: Secondary | ICD-10-CM | POA: Diagnosis not present

## 2017-07-23 DIAGNOSIS — N3946 Mixed incontinence: Secondary | ICD-10-CM | POA: Insufficient documentation

## 2017-07-23 DIAGNOSIS — Z01419 Encounter for gynecological examination (general) (routine) without abnormal findings: Secondary | ICD-10-CM | POA: Diagnosis not present

## 2017-07-23 MED FILL — OXYBUTYNIN CL ER 10 MG TAB: 10 | 90 days supply | Qty: 90 | Fill #0

## 2017-07-24 ENCOUNTER — Telehealth: Payer: Self-pay

## 2017-07-26 MED FILL — FLUCONAZOLE 150 MG TABS: 150 | 6 days supply | Qty: 3 | Fill #0

## 2017-07-29 DIAGNOSIS — H15003 Unspecified scleritis, bilateral: Secondary | ICD-10-CM | POA: Diagnosis not present

## 2017-07-29 MED FILL — MYRBETRIQ ER 25 MG TABLET: 25 | 30 days supply | Qty: 30 | Fill #0

## 2017-07-29 MED FILL — TOBRAMYCIN-DEXAMETH OPTH SU: 0.3-0.1 | 13 days supply | Qty: 5 | Fill #0

## 2017-07-29 MED FILL — SYNJARDY XR 12.5-1,000 MG T: 12.5-1000 | 30 days supply | Qty: 60 | Fill #0

## 2017-08-12 MED FILL — TRULICITY 1.5 MG/0.5 ML PEN: 1.5 | 28 days supply | Qty: 2 | Fill #1

## 2017-08-14 ENCOUNTER — Encounter: Payer: Self-pay | Admitting: Allergy and Immunology

## 2017-08-14 ENCOUNTER — Ambulatory Visit: Payer: 59 | Admitting: Allergy and Immunology

## 2017-08-14 VITALS — BP 118/70 | HR 80 | Temp 98.3°F | Resp 16 | Ht 61.0 in | Wt 187.2 lb

## 2017-08-14 DIAGNOSIS — K2970 Gastritis, unspecified, without bleeding: Secondary | ICD-10-CM

## 2017-08-14 DIAGNOSIS — J3089 Other allergic rhinitis: Secondary | ICD-10-CM | POA: Diagnosis not present

## 2017-08-14 DIAGNOSIS — T7840XA Allergy, unspecified, initial encounter: Secondary | ICD-10-CM | POA: Insufficient documentation

## 2017-08-14 DIAGNOSIS — L500 Allergic urticaria: Secondary | ICD-10-CM | POA: Diagnosis not present

## 2017-08-14 DIAGNOSIS — J302 Other seasonal allergic rhinitis: Secondary | ICD-10-CM | POA: Insufficient documentation

## 2017-08-14 DIAGNOSIS — T7840XD Allergy, unspecified, subsequent encounter: Secondary | ICD-10-CM | POA: Diagnosis not present

## 2017-08-14 MED ORDER — RANITIDINE HCL 150 MG PO CAPS: 150.0000 mg | ORAL_CAPSULE | Freq: Two times a day (BID) | ORAL | 5 refills | Status: DC

## 2017-08-14 MED ORDER — LEVOCETIRIZINE DIHYDROCHLORIDE 5 MG PO TABS: 5.0000 mg | ORAL_TABLET | Freq: Every evening | ORAL | 5 refills | Status: DC

## 2017-08-14 MED ORDER — FLUTICASONE PROPIONATE 50 MCG/ACT NA SUSP: 1.0000 | Freq: Two times a day (BID) | NASAL | 5 refills | Status: DC | PRN

## 2017-08-14 NOTE — Progress Notes (Signed)
New Patient Note  RE: Theresa Meyers MRN: [id number] DOB: 1955-07-29 Date of Office Visit: 08/14/2017  Referring provider: Jonathon Resides, MD Primary care provider: Jonathon Resides, MD  Chief Complaint: Allergic Reaction and Urticaria   History of present illness: Theresa Meyers is a 62 y.o. female seen today in consultation requested by Theresa Bible, MD.  From April 11th - 19th, [name] experienced recurrent episodes of hives. The distribution of hives included the neck, back and arms.  The lesions are described as erythematous, raised, and pruritic.  Individual hives lasted less than 24 hours without leaving residual pigmentation or bruising. She denied concomitant angioedema, cardiopulmonary symptoms and GI symptoms.  She has not experienced unexpected weight loss, recurrent fevers or drenching night sweats. No specific medication, food, skin care product, detergent, soap, or other environmental triggers have been identified. The symptoms did not seem to correlate with NSAIDs use or emotional stress. She did not have symptoms consistent with a respiratory tract infection at the time of symptom onset. Theresa Meyers tried to control symptoms with systemic steroids and OTC antihistamines which offered adequate temporary relief. She has been evaluated and treated in urgent care and at her primary care physician's office for these symptoms. Skin biopsy has not been performed. The hives resolved on April 19th without recurrence.  Assessment and plan: Urticaria Unclear etiology.  The timing and skin test results suggest the possibility of allergic urticaria secondary to pollen exposure.  Skin tests to select food allergens were negative today the exception of borderline positive results to shrimp and lobster, however she has never experienced adverse symptoms associated with the consumption of shellfish and had not consumed shellfish around the time of her urticarial outbreaks.. NSAIDs and emotional stress  commonly exacerbate urticaria but are not the underlying etiology in this case. Physical urticarias are negative by history (i.e. pressure-induced, temperature, vibration, solar, etc.). There are no concomitant symptoms concerning for anaphylaxis or constitutional symptoms worrisome for an underlying malignancy. We will rule out other potential etiologies with labs. For symptom relief, patient is to take oral antihistamines as directed.  The following labs have been ordered: FCeRI antibody, anti-thyroglobulin antibody, thyroid peroxidase antibody, tryptase, urea breath test, CBC, CMP, ESR, ANA, and serum specific IgE against shellfish panel and galactose-alpha-1,3-galactose panel.  The patient will be called with further recommendations after lab results have returned.  Instructions have been discussed and provided for H1/H2 receptor blockade with titration to find lowest effective dose.  A prescription has been provided for levocetirizine, 5 mg daily as needed.  A prescription has been provided for ranitidine 150 mg twice daily as needed.  Should there be a significant increase or change in symptoms, a journal is to be kept recording any foods eaten, beverages consumed, medications taken within a 6 hour period prior to the onset of symptoms, as well as record activities being performed, and environmental conditions. For any symptoms concerning for anaphylaxis, 911 is to be called immediately.  Seasonal and perennial allergic rhinitis  Aeroallergen avoidance measures have been discussed and provided in written form.  Levocetirizine has been prescribed (as above).  A prescription has been provided for fluticasone nasal spray, one spray per nostril 1-2 times daily as needed. Proper nasal spray technique has been discussed and demonstrated.  Nasal saline spray (i.e., Simply Saline) or nasal saline lavage (i.e., NeilMed) is recommended as needed and prior to medicated nasal sprays.   Meds  ordered this encounter  Medications  . levocetirizine (XYZAL) 5  MG tablet    Sig: Take 1 tablet (5 mg total) by mouth every evening.    Dispense:  60 tablet    Refill:  5  . ranitidine (ZANTAC) 150 MG capsule    Sig: Take 1 capsule (150 mg total) by mouth 2 (two) times daily.    Dispense:  60 capsule    Refill:  5  . fluticasone (FLONASE) 50 MCG/ACT nasal spray    Sig: Place 1 spray into both nostrils 2 (two) times daily as needed for allergies or rhinitis.    Dispense:  18.2 g    Refill:  5    Diagnostics: Environmental skin testing: Robust reactivity to grass pollen, tree pollen, and weed pollen.  Positive to mold and dust mite antigen. Food allergen skin testing: Borderline positive to shrimp and lobster.  However, the patient reports that she has never had symptoms with the consumption of shellfish and did not consume shellfish prior to/during the onset of the symptoms we are evaluating today.    Physical examination: Blood pressure 118/70, pulse 80, temperature 98.3 F (36.8 C), temperature source Oral, resp. rate 16, height _0  (1.549 m), weight 187 lb 2.7 oz (84.9 kg), SpO2 96 %.  General: Alert, interactive, in no acute distress. HEENT: TMs pearly gray, turbinates moderately edematous with thick discharge, post-pharynx moderately erythematous. Neck: Supple without lymphadenopathy. Lungs: Clear to auscultation without wheezing, rhonchi or rales. CV: Normal S1, S2 without murmurs. Abdomen: Nondistended, nontender. Skin: Warm and dry, without lesions or rashes. Extremities:  No clubbing, cyanosis or edema. Neuro:   Grossly intact.  Review of systems:  Review of systems negative except as noted in HPI / PMHx or noted below: Review of Systems  Constitutional: Negative.   HENT: Negative.   Eyes: Negative.   Respiratory: Negative.   Cardiovascular: Negative.   Gastrointestinal: Negative.   Genitourinary: Negative.   Musculoskeletal: Negative.   Skin: Negative.     Neurological: Negative.   Endo/Heme/Allergies: Negative.   Psychiatric/Behavioral: Negative.     Past medical history:  Past Medical History:  Diagnosis Date  . Anemia   . Colon polyp   . Diabetes mellitus   . Discoid lupus    Dr. Abner Greenspan  . Hypercholesteremia   . Vitamin D deficiency     Past surgical history:  Past Surgical History:  Procedure Laterality Date  . CARPAL TUNNEL RELEASE    . FOOT SURGERY      Family history: Family History  Problem Relation Age of Onset  . Diabetes Mother   . Hypertension Mother   . Diabetes Father   . Hypertension Father   . Cancer Father        Colon  . Urticaria Father   . Rheum arthritis Sister   . Colon polyps Sister   . Urticaria Brother   . Allergic rhinitis Neg Hx   . Angioedema Neg Hx   . Asthma Neg Hx   . Eczema Neg Hx   . Immunodeficiency Neg Hx     Social history: Social History   Socioeconomic History  . Marital status: Married    Spouse name: Not on file  . Number of children: Not on file  . Years of education: Not on file  . Highest education level: Not on file  Occupational History  . Not on file  Social Needs  . Financial resource strain: Not on file  . Food insecurity:    Worry: Not on file    Inability: Not on file  .  Transportation needs:    Medical: Not on file    Non-medical: Not on file  Tobacco Use  . Smoking status: Never Smoker  . Smokeless tobacco: Never Used  Substance and Sexual Activity  . Alcohol use: No  . Drug use: No  . Sexual activity: Yes  Lifestyle  . Physical activity:    Days per week: Not on file    Minutes per session: Not on file  . Stress: Not on file  Relationships  . Social connections:    Talks on phone: Not on file    Gets together: Not on file    Attends religious service: Not on file    Active member of club or organization: Not on file    Attends meetings of clubs or organizations: Not on file    Relationship status: Not on file  . Intimate partner  violence:    Fear of current or ex partner: Not on file    Emotionally abused: Not on file    Physically abused: Not on file    Forced sexual activity: Not on file  Other Topics Concern  . Not on file  Social History Narrative   Marital Status:  Married Simona Huh)   Children:  Son Enid Derry) 64; Daughter (Chanell) [phone].   Pets:  None   Living Situation: Lives with spouse and children.    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  She is a non-smoker without pets.  There is no known mold/water damage in the home.  Allergies as of 08/14/2017   No Known Allergies     Medication List        Accurate as of 08/14/17 12:48 PM. Always use your most recent med list.          atorvastatin 40 MG tablet Commonly known as:  LIPITOR Take 1 tablet (40 mg total) by mouth daily.   Empagliflozin-metFORMIN HCl ER 12.07-998 MG Tb24 Take by mouth.   EXEL COMFORT POINT PEN NEEDLE 31G X 6 MM Misc Generic drug:  Insulin Pen Needle Use one pen needle as directed   FIFTY50 GLUCOSE METER 2.0 w/Device Kit Use to check blood sugar as directed   fluticasone 50 MCG/ACT nasal spray Commonly known as:  FLONASE Place 1 spray into both nostrils 2 (two) times daily as needed for allergies or rhinitis.   freestyle lancets Use to check blood sugar once daily   HYDROcodone-acetaminophen 5-325 MG tablet Commonly known as:  NORCO/VICODIN Take by mouth.   levocetirizine 5 MG tablet Commonly known as:  XYZAL Take 1 tablet (5 mg total) by mouth every evening.   mirabegron ER 25 MG Tb24 tablet Commonly known as:  MYRBETRIQ Take by mouth.   ranitidine 150 MG capsule Commonly known  as:  ZANTAC Take 1 capsule (150 mg total) by mouth 2 (two) times daily.   sitaGLIPtin-metformin 50-1000 MG tablet Commonly known as:  JANUMET Take 1 tablet by mouth 2 (two) times daily with a meal.   tobramycin-dexamethasone ophthalmic solution Commonly known as:  TOBRADEX   TRULICITY 1.5 XB/2.8UX Sopn Generic drug:  Dulaglutide Inject into the skin.       Known medication allergies: No Known Allergies  I appreciate the opportunity to take part in Kasie's care. Please do not hesitate to contact me with questions.  Sincerely,   R. Edgar Frisk, MD

## 2017-08-14 NOTE — Assessment & Plan Note (Addendum)
Unclear etiology.  The timing and skin test results suggest the possibility of allergic urticaria secondary to pollen exposure.  Skin tests to select food allergens were negative today the exception of borderline positive results to shrimp and lobster, however she has never experienced adverse symptoms associated with the consumption of shellfish and had not consumed shellfish around the time of her urticarial outbreaks.. NSAIDs and emotional stress commonly exacerbate urticaria but are not the underlying etiology in this case. Physical urticarias are negative by history (i.e. pressure-induced, temperature, vibration, solar, etc.). There are no concomitant symptoms concerning for anaphylaxis or constitutional symptoms worrisome for an underlying malignancy. We will rule out other potential etiologies with labs. For symptom relief, patient is to take oral antihistamines as directed.  The following labs have been ordered: FCeRI antibody, anti-thyroglobulin antibody, thyroid peroxidase antibody, tryptase, urea breath test, CBC, CMP, ESR, ANA, and serum specific IgE against shellfish panel and galactose-alpha-1,3-galactose panel.  The patient will be called with further recommendations after lab results have returned.  Instructions have been discussed and provided for H1/H2 receptor blockade with titration to find lowest effective dose.  A prescription has been provided for levocetirizine, 5 mg daily as needed.  A prescription has been provided for ranitidine 150 mg twice daily as needed.  Should there be a significant increase or change in symptoms, a journal is to be kept recording any foods eaten, beverages consumed, medications taken within a 6 hour period prior to the onset of symptoms, as well as record activities being performed, and environmental conditions. For any symptoms concerning for anaphylaxis, 911 is to be called immediately.

## 2017-08-14 NOTE — Patient Instructions (Addendum)
Urticaria Unclear etiology.  The timing and skin test results suggest the possibility of allergic urticaria secondary to pollen exposure.  Skin tests to select food allergens were negative today the exception of borderline positive results to shrimp and lobster, however she has never experienced adverse symptoms associated with the consumption of shellfish and had not consumed shellfish around the time of her urticarial outbreaks.. NSAIDs and emotional stress commonly exacerbate urticaria but are not the underlying etiology in this case. Physical urticarias are negative by history (i.e. pressure-induced, temperature, vibration, solar, etc.). There are no concomitant symptoms concerning for anaphylaxis or constitutional symptoms worrisome for an underlying malignancy. We will rule out other potential etiologies with labs. For symptom relief, patient is to take oral antihistamines as directed.  The following labs have been ordered: FCeRI antibody, anti-thyroglobulin antibody, thyroid peroxidase antibody, tryptase, urea breath test, CBC, CMP, ESR, ANA, and serum specific IgE against shellfish panel and galactose-alpha-1,3-galactose panel.  The patient will be called with further recommendations after lab results have returned.  Instructions have been discussed and provided for H1/H2 receptor blockade with titration to find lowest effective dose.  A prescription has been provided for levocetirizine, 5 mg daily as needed.  A prescription has been provided for ranitidine 150 mg twice daily as needed.  Should there be a significant increase or change in symptoms, a journal is to be kept recording any foods eaten, beverages consumed, medications taken within a 6 hour period prior to the onset of symptoms, as well as record activities being performed, and environmental conditions. For any symptoms concerning for anaphylaxis, 911 is to be called immediately.  Seasonal and perennial allergic  rhinitis  Aeroallergen avoidance measures have been discussed and provided in written form.  Levocetirizine has been prescribed (as above).  A prescription has been provided for fluticasone nasal spray, one spray per nostril 1-2 times daily as needed. Proper nasal spray technique has been discussed and demonstrated.  Nasal saline spray (i.e., Simply Saline) or nasal saline lavage (i.e., NeilMed) is recommended as needed and prior to medicated nasal sprays.   When lab results have returned the patient will be called with further recommendations and follow up instructions.  Urticaria (Hives)  . Levocetirizine (Xyzal) 5 mg twice a day and ranitidine (Zantac) 150 mg twice a day. If no symptoms for 7-14 days then decrease to. . Levocetirizine (Xyzal) 5 mg twice a day and ranitidine (Zantac) 150 mg once a day.  If no symptoms for 7-14 days then decrease to. . Levocetirizine (Xyzal) 5 mg twice a day.  If no symptoms for 7-14 days then decrease to. . Levocetirizine (Xyzal) 5 mg once a day.  May use Benadryl (diphenhydramine) as needed for breakthrough symptoms       If symptoms return, then step up dosage  Reducing Pollen Exposure  The American Academy of Allergy, Asthma and Immunology suggests the following steps to reduce your exposure to pollen during allergy seasons.    1. Do not hang sheets or clothing out to dry; pollen may collect on these items. 2. Do not mow lawns or spend time around freshly cut grass; mowing stirs up pollen. 3. Keep windows closed at night.  Keep car windows closed while driving. 4. Minimize morning activities outdoors, a time when pollen counts are usually at their highest. 5. Stay indoors as much as possible when pollen counts or humidity is high and on windy days when pollen tends to remain in the air longer. 6. Use air conditioning when possible.  Many air conditioners have filters that trap the pollen spores. 7. Use a HEPA room air filter to remove pollen  form the indoor air you breathe.   Control of House Dust Mite Allergen  House dust mites play a major role in allergic asthma and rhinitis.  They occur in environments with high humidity wherever human skin, the food for dust mites is found. High levels have been detected in dust obtained from mattresses, pillows, carpets, upholstered furniture, bed covers, clothes and soft toys.  The principal allergen of the house dust mite is found in its feces.  A gram of dust may contain 1,000 mites and 250,000 fecal particles.  Mite antigen is easily measured in the air during house cleaning activities.    1. Encase mattresses, including the box spring, and pillow, in an air tight cover.  Seal the zipper end of the encased mattresses with wide adhesive tape. 2. Wash the bedding in water of 130 degrees Farenheit weekly.  Avoid cotton comforters/quilts and flannel bedding: the most ideal bed covering is the dacron comforter. 3. Remove all upholstered furniture from the bedroom. 4. Remove carpets, carpet padding, rugs, and non-washable window drapes from the bedroom.  Wash drapes weekly or use plastic window coverings. 5. Remove all non-washable stuffed toys from the bedroom.  Wash stuffed toys weekly. 6. Have the room cleaned frequently with a vacuum cleaner and a damp dust-mop.  The patient should not be in a room which is being cleaned and should wait 1 hour after cleaning before going into the room. 7. Close and seal all heating outlets in the bedroom.  Otherwise, the room will become filled with dust-laden air.  An electric heater can be used to heat the room. 8. Reduce indoor humidity to less than 50%.  Do not use a humidifier.  Control of Mold Allergen  Mold and fungi can grow on a variety of surfaces provided certain temperature and moisture conditions exist.  Outdoor molds grow on plants, decaying vegetation and soil.  The major outdoor mold, Alternaria and Cladosporium, are found in very high numbers  during hot and dry conditions.  Generally, a late Summer - Fall peak is seen for common outdoor fungal spores.  Rain will temporarily lower outdoor mold spore count, but counts rise rapidly when the rainy period ends.  The most important indoor molds are Aspergillus and Penicillium.  Dark, humid and poorly ventilated basements are ideal sites for mold growth.  The next most common sites of mold growth are the bathroom and the kitchen.  Outdoor [employer] 1. Use air conditioning and keep windows closed 2. Avoid exposure to decaying vegetation. 3. Avoid leaf raking. 4. Avoid grain handling. 5. Consider wearing a face mask if working in moldy areas.  Indoor Mold Control 1. Maintain humidity below 50%. 2. Clean washable surfaces with 5% bleach solution. 3. Remove sources e.g. Contaminated carpets.

## 2017-08-14 NOTE — Assessment & Plan Note (Signed)
   Aeroallergen avoidance measures have been discussed and provided in written form.  Levocetirizine has been prescribed (as above).  A prescription has been provided for fluticasone nasal spray, one spray per nostril 1-2 times daily as needed. Proper nasal spray technique has been discussed and demonstrated.  Nasal saline spray (i.e., Simply Saline) or nasal saline lavage (i.e., NeilMed) is recommended as needed and prior to medicated nasal sprays.

## 2017-08-16 LAB — H. PYLORI BREATH TEST: H pylori Breath Test: NEGATIVE

## 2017-08-21 ENCOUNTER — Other Ambulatory Visit: Payer: Self-pay | Admitting: *Deleted

## 2017-08-21 LAB — ALPHA-GAL PANEL
Alpha Gal IgE*: <0.1 kU/L (ref <0.10)
Beef (Bos spp) IgE: 0.16 kU/L (ref <0.35)
Class Interpretation: 0
Lamb/Mutton (Ovis spp) IgE: 0.1 kU/L (ref <0.35)
Pork (Sus spp) IgE: <0.1 kU/L (ref <0.35)

## 2017-08-21 LAB — SEDIMENTATION RATE: Sed Rate: 7 mm/hr (ref 0–40)

## 2017-08-21 LAB — COMPREHENSIVE METABOLIC PANEL
ALT: 15 IU/L (ref 0–32)
AST: 15 IU/L (ref 0–40)
Albumin/Globulin Ratio: 1.6 (ref 1.2–2.2)
Albumin: 4.1 g/dL (ref 3.5–5.5)
Alkaline Phosphatase: 83 IU/L (ref 39–117)
BUN/Creatinine Ratio: 11 (ref 9–23)
BUN: 9 mg/dL (ref 6–24)
Bilirubin Total: 0.3 mg/dL (ref 0.0–1.2)
CO2: 22 mmol/L (ref 20–29)
Calcium: 9.2 mg/dL (ref 8.7–10.2)
Chloride: 104 mmol/L (ref 96–106)
Creatinine, Ser: 0.8 mg/dL (ref 0.57–1.00)
GFR calc Af Amer: 93 mL/min/{1.73_m2} (ref >59)
GFR calc non Af Amer: 81 mL/min/{1.73_m2} (ref >59)
Globulin, Total: 2.5 g/dL (ref 1.5–4.5)
Glucose: 119 mg/dL — ABNORMAL HIGH (ref 65–99)
Potassium: 4.2 mmol/L (ref 3.5–5.2)
Sodium: 141 mmol/L (ref 134–144)
Total Protein: 6.6 g/dL (ref 6.0–8.5)

## 2017-08-21 LAB — CBC
Hematocrit: 43.4 % (ref 34.0–46.6)
Hemoglobin: 14.3 g/dL (ref 11.1–15.9)
MCH: 29 pg (ref 26.6–33.0)
MCHC: 32.9 g/dL (ref 31.5–35.7)
MCV: 88 fL (ref 79–97)
Platelets: 335 10*3/uL (ref 150–379)
RBC: 4.93 x10E6/uL (ref 3.77–5.28)
RDW: 15.3 % (ref 12.3–15.4)
WBC: 3.8 10*3/uL (ref 3.4–10.8)

## 2017-08-21 LAB — TRYPTASE: Tryptase: 3.6 ug/L (ref 2.2–13.2)

## 2017-08-21 LAB — ALLERGEN PROFILE, SHELLFISH
Clam IgE: <0.1 kU/L
F023-IgE Crab: <0.1 kU/L
F080-IgE Lobster: <0.1 kU/L
F290-IgE Oyster: <0.1 kU/L
Scallop IgE: <0.1 kU/L
Shrimp IgE: 0.41 kU/L — AB

## 2017-08-21 LAB — CHRONIC URTICARIA: cu index: <4.7 (ref <10)

## 2017-08-21 LAB — THYROID PEROXIDASE ANTIBODY: Thyroperoxidase Ab SerPl-aCnc: <6 IU/mL (ref 0–34)

## 2017-08-21 LAB — ANA: Anti Nuclear Antibody(ANA): POSITIVE — AB

## 2017-08-21 LAB — THYROGLOBULIN ANTIBODY: Thyroglobulin Antibody: <1 IU/mL (ref 0.0–0.9)

## 2017-08-21 MED ORDER — EPINEPHRINE 0.3 MG/0.3ML IJ SOAJ: INTRAMUSCULAR | 2 refills | Status: DC

## 2017-08-21 MED FILL — EPINEPHRINE 0.3 MG AUTO-INJ: 0.3 | 2 days supply | Qty: 2 | Fill #0

## 2017-09-11 MED FILL — TRULICITY 1.5 MG/0.5 ML PEN: 1.5 | 28 days supply | Qty: 2 | Fill #2

## 2017-09-11 MED FILL — SYNJARDY XR 12.5-1,000 MG T: 12.5-1000 | 30 days supply | Qty: 60 | Fill #1

## 2017-10-01 DIAGNOSIS — M2041 Other hammer toe(s) (acquired), right foot: Secondary | ICD-10-CM | POA: Diagnosis not present

## 2017-10-01 DIAGNOSIS — M2011 Hallux valgus (acquired), right foot: Secondary | ICD-10-CM | POA: Diagnosis not present

## 2017-10-11 MED FILL — SYNJARDY XR 12.5-1,000 MG T: 12.5-1000 | 30 days supply | Qty: 60 | Fill #2

## 2017-10-11 MED FILL — TRULICITY 1.5 MG/0.5 ML PEN: 1.5 | 28 days supply | Qty: 2 | Fill #3

## 2017-10-11 MED FILL — ATORVASTATIN 40 MG TABLET: 40 | 90 days supply | Qty: 90 | Fill #3

## 2017-11-05 MED FILL — TRULICITY 1.5 MG/0.5 ML PEN: 1.5 | 28 days supply | Qty: 2 | Fill #4

## 2017-12-09 MED FILL — TRULICITY 1.5 MG/0.5 ML PEN: 1.5 | 28 days supply | Qty: 2 | Fill #5

## 2017-12-13 MED FILL — MYRBETRIQ ER 25 MG TABLET: 25 | 30 days supply | Qty: 30 | Fill #0

## 2017-12-20 DIAGNOSIS — M79602 Pain in left arm: Secondary | ICD-10-CM | POA: Diagnosis not present

## 2017-12-20 DIAGNOSIS — M542 Cervicalgia: Secondary | ICD-10-CM | POA: Diagnosis not present

## 2017-12-20 DIAGNOSIS — R5383 Other fatigue: Secondary | ICD-10-CM | POA: Diagnosis not present

## 2017-12-20 DIAGNOSIS — M25512 Pain in left shoulder: Secondary | ICD-10-CM | POA: Diagnosis not present

## 2017-12-20 DIAGNOSIS — L720 Epidermal cyst: Secondary | ICD-10-CM | POA: Diagnosis not present

## 2017-12-20 MED FILL — CYCLOBENZAPRINE 5 MG TABLET: 5 | 30 days supply | Qty: 30 | Fill #0

## 2017-12-20 MED FILL — CEPHALEXIN 500 MG CAPSULE: 500 | 7 days supply | Qty: 14 | Fill #0

## 2017-12-20 MED FILL — MELOXICAM 7.5 MG TABLET: 7.5 | 30 days supply | Qty: 30 | Fill #0

## 2017-12-20 MED FILL — traMADol HCL 50 MG TABS: 50 | 5 days supply | Qty: 20 | Fill #0

## 2017-12-25 DIAGNOSIS — M79641 Pain in right hand: Secondary | ICD-10-CM | POA: Diagnosis not present

## 2017-12-25 DIAGNOSIS — M542 Cervicalgia: Secondary | ICD-10-CM | POA: Diagnosis not present

## 2017-12-25 DIAGNOSIS — M50123 Cervical disc disorder at C6-C7 level with radiculopathy: Secondary | ICD-10-CM | POA: Diagnosis not present

## 2017-12-25 DIAGNOSIS — G5602 Carpal tunnel syndrome, left upper limb: Secondary | ICD-10-CM | POA: Diagnosis not present

## 2017-12-25 DIAGNOSIS — M5412 Radiculopathy, cervical region: Secondary | ICD-10-CM | POA: Diagnosis not present

## 2017-12-25 DIAGNOSIS — M79642 Pain in left hand: Secondary | ICD-10-CM | POA: Diagnosis not present

## 2017-12-25 MED FILL — MELOXICAM 15 MG TABLET: 15 | 30 days supply | Qty: 30 | Fill #0

## 2017-12-25 MED FILL — METHYLPREDNISOLONE 4 MG TAB: 4 | 6 days supply | Qty: 21 | Fill #0

## 2017-12-26 DIAGNOSIS — E119 Type 2 diabetes mellitus without complications: Secondary | ICD-10-CM | POA: Diagnosis not present

## 2017-12-26 DIAGNOSIS — E669 Obesity, unspecified: Secondary | ICD-10-CM | POA: Diagnosis not present

## 2017-12-26 DIAGNOSIS — Z23 Encounter for immunization: Secondary | ICD-10-CM | POA: Diagnosis not present

## 2017-12-26 DIAGNOSIS — E785 Hyperlipidemia, unspecified: Secondary | ICD-10-CM | POA: Diagnosis not present

## 2017-12-26 MED FILL — SYNJARDY XR 12.5-1,000 MG T: 12.5-1000 | 30 days supply | Qty: 60 | Fill #0

## 2017-12-26 MED FILL — ATORVASTATIN 40 MG TABLET: 40 | 90 days supply | Qty: 90 | Fill #0

## 2017-12-31 DIAGNOSIS — M50323 Other cervical disc degeneration at C6-C7 level: Secondary | ICD-10-CM | POA: Diagnosis not present

## 2017-12-31 DIAGNOSIS — M5412 Radiculopathy, cervical region: Secondary | ICD-10-CM | POA: Diagnosis not present

## 2017-12-31 DIAGNOSIS — G5602 Carpal tunnel syndrome, left upper limb: Secondary | ICD-10-CM | POA: Diagnosis not present

## 2017-12-31 MED FILL — GABAPENTIN 100 MG CAPSULE: 100 | 30 days supply | Qty: 90 | Fill #0

## 2018-01-06 ENCOUNTER — Ambulatory Visit
Admission: RE | Admit: 2018-01-06 | Discharge: 2018-01-06 | Disposition: A | Discharge status: Home / Self Care | Payer: 59 | Source: Ambulatory Visit | Attending: Sports Medicine | Admitting: Sports Medicine

## 2018-01-06 ENCOUNTER — Other Ambulatory Visit: Payer: Self-pay | Admitting: Sports Medicine

## 2018-01-06 DIAGNOSIS — M5412 Radiculopathy, cervical region: Secondary | ICD-10-CM

## 2018-01-06 DIAGNOSIS — M4802 Spinal stenosis, cervical region: Secondary | ICD-10-CM | POA: Diagnosis not present

## 2018-01-08 DIAGNOSIS — M5412 Radiculopathy, cervical region: Secondary | ICD-10-CM | POA: Diagnosis not present

## 2018-01-08 DIAGNOSIS — G5602 Carpal tunnel syndrome, left upper limb: Secondary | ICD-10-CM | POA: Diagnosis not present

## 2018-01-10 MED FILL — TRULICITY 1.5 MG/0.5 ML PEN: 1.5 | 28 days supply | Qty: 2 | Fill #0

## 2018-01-10 MED FILL — FREESTYLE LITE TEST STRIP: 50 days supply | Qty: 50 | Fill #1

## 2018-01-10 MED FILL — FREESTYLE LANCETS: 90 days supply | Qty: 100 | Fill #1

## 2018-01-21 ENCOUNTER — Other Ambulatory Visit: Payer: Self-pay

## 2018-01-21 ENCOUNTER — Encounter: Payer: Self-pay | Admitting: Physical Therapy

## 2018-01-21 ENCOUNTER — Other Ambulatory Visit: Payer: Self-pay | Admitting: Family Medicine

## 2018-01-21 ENCOUNTER — Ambulatory Visit: Payer: 59 | Attending: Neurosurgery | Admitting: Physical Therapy

## 2018-01-21 DIAGNOSIS — R252 Cramp and spasm: Secondary | ICD-10-CM | POA: Insufficient documentation

## 2018-01-21 DIAGNOSIS — M6281 Muscle weakness (generalized): Secondary | ICD-10-CM | POA: Diagnosis not present

## 2018-01-21 DIAGNOSIS — M542 Cervicalgia: Secondary | ICD-10-CM | POA: Insufficient documentation

## 2018-01-21 DIAGNOSIS — R030 Elevated blood-pressure reading, without diagnosis of hypertension: Secondary | ICD-10-CM | POA: Diagnosis not present

## 2018-01-21 DIAGNOSIS — M5020 Other cervical disc displacement, unspecified cervical region: Secondary | ICD-10-CM | POA: Diagnosis not present

## 2018-01-21 DIAGNOSIS — Z6835 Body mass index (BMI) 35.0-35.9, adult: Secondary | ICD-10-CM | POA: Diagnosis not present

## 2018-01-21 DIAGNOSIS — M5030 Other cervical disc degeneration, unspecified cervical region: Secondary | ICD-10-CM | POA: Diagnosis not present

## 2018-01-21 DIAGNOSIS — Z1231 Encounter for screening mammogram for malignant neoplasm of breast: Secondary | ICD-10-CM

## 2018-01-21 DIAGNOSIS — M5412 Radiculopathy, cervical region: Secondary | ICD-10-CM | POA: Diagnosis not present

## 2018-01-21 DIAGNOSIS — G5602 Carpal tunnel syndrome, left upper limb: Secondary | ICD-10-CM | POA: Diagnosis not present

## 2018-01-21 DIAGNOSIS — M4722 Other spondylosis with radiculopathy, cervical region: Secondary | ICD-10-CM | POA: Diagnosis not present

## 2018-01-21 NOTE — Therapy (Signed)
[facility] [address] [place], [place], [address] Phone: [phone]   Fax:  [phone]  Physical Therapy Evaluation  Patient Details  Name: Theresa Meyers MRN: [id number] Date of Birth: 02/23/1956 Referring Provider (PT): Dr, Jovita Gamma   Encounter Date: 01/21/2018  PT End of Session - 01/21/18 1659    Visit Number  1    Date for PT Re-Evaluation  03/04/18    Authorization Type  UMR    PT Start Time  1615    PT Stop Time  1655    PT Time Calculation (min)  40 min    Activity Tolerance  Patient tolerated treatment well    Behavior During Therapy  [facility] for tasks assessed/performed       Past Medical History:  Diagnosis Date  . Anemia   . Colon polyp   . Diabetes mellitus   . Discoid lupus    Dr. Abner Greenspan  . Hypercholesteremia   . Vitamin D deficiency     Past Surgical History:  Procedure Laterality Date  . CARPAL TUNNEL RELEASE    . FOOT SURGERY      There were no vitals filed for this visit.   Subjective Assessment - 01/21/18 1621    Subjective  Patient reports over a month she was getting pain in her left cervical and left shoulder. Patient has numbness to the touch on the lateral upper arm and 2nd and thired fingers are numb.      Patient Stated Goals  avoid surgery,     Currently in Pain?  Yes    Pain Score  5     Pain Location  Neck    Pain Orientation  Left    Pain Descriptors / Indicators  Tingling;Numbness;Aching    Pain Type  Acute pain    Pain Radiating Towards  radiates into left upper extremity    Pain Onset  More than a month ago    Pain Frequency  Intermittent    Aggravating Factors   sitting, standing, typing    Pain Relieving Factors  bring left arm over head         [facility] PT Assessment - 01/21/18 0001      Assessment   Medical Diagnosis  M54.12 Cervical radiculopathy    Referring Provider (PT)  Dr, Jovita Gamma    Onset Date/Surgical Date  12/22/17    Hand  Dominance  Right    Prior Therapy  none      Precautions   Precautions  None      Restrictions   Weight Bearing Restrictions  No      Balance Screen   Has the patient fallen in the past 6 months  No    Has the patient had a decrease in activity level because of a fear of falling?   No    Is the patient reluctant to leave their home because of a fear of falling?   No      Home Film/video editor residence      Prior Function   Level of Independence  Independent    Vocation  Full time employment    Vocation Requirements  sitting, answer phones, turning head      Cognition   Overall Cognitive Status  Within Functional Limits for tasks assessed      Observation/Other Assessments   Focus on Therapeutic Outcomes (FOTO)   38% limitation; goal is 29% limitation  Sensation   Light Touch  Impaired by gross assessment    Additional Comments  decreased on lateral upper arm, decreased on left index finger ant and post, anterior middle finger      Posture/Postural Control   Posture/Postural Control  Postural limitations    Posture Comments  increased cervical thoracic kyphosis      ROM / Strength   AROM / PROM / Strength  AROM;PROM;Strength      AROM   Cervical Flexion  50    Cervical Extension  20    Cervical - Right Side Bend  15    Cervical - Left Side Bend  18    Cervical - Right Rotation  59    Cervical - Left Rotation  55      Strength   Left Shoulder Flexion  4/5    Left Shoulder External Rotation  4/5    Right Hand Grip (lbs)  45    Left Hand Grip (lbs)  42      Palpation   Spinal mobility  decreased mobility of C5-T4    Palpation comment  tenderness located in the left interscapular area      Special Tests   Other special tests  positive neural tension stretch for left radial nerve.                 Objective measurements completed on examination: See above findings.              PT Education - 01/21/18 1655     Education Details  Access Code: [email or web address]    Person(s) Educated  Patient    Methods  Explanation;Demonstration;Handout    Comprehension  Returned demonstration;Verbalized understanding       PT Short Term Goals - 01/21/18 1705      PT SHORT TERM GOAL #1   Title  independent with intial HEP    Time  3    Period  Weeks    Status  New    Target Date  02/11/18      PT SHORT TERM GOAL #2   Title  cervical pain decreased >/= 25%     Time  3    Period  Weeks    Status  New    Target Date  02/11/18        PT Long Term Goals - 01/21/18 1706      PT LONG TERM GOAL #1   Title  independent with HEP and understand how to progress herself    Time  6    Period  Weeks    Status  New    Target Date  03/04/18      PT LONG TERM GOAL #2   Title  cervical pain in sitting and standing decreased >/= 60%     Time  6    Period  Weeks    Status  New    Target Date  03/04/18      PT LONG TERM GOAL #3   Title  cervical ROM improved so she is able to turn her head to look behind due to increased mobility    Time  6    Period  Weeks    Status  New    Target Date  03/04/18      PT LONG TERM GOAL #4   Title  ability to type on the computer without difficulty due to decreased numbness in the left fingers    Time  6  Period  Weeks    Status  New    Target Date  03/04/18             Plan - 01/21/18 1659    Clinical Impression Statement  Patient is a 62 year old female with sudden onset of left cervical pain that will radiate down left arm.  Patient reports numbness in the lateral aspect of the upper arm and left second and third finger. Patient has decreased cervical ROM due to pain and decreased vertebral mobility.  Palpable tenderness located in left interscapular area. Decreased strength of the left shoulder for flexion and external rotation. Patient has equal grip strength bilaterally.  Patient has increased pain upright position that will go to 5/10.  Patient is unable to retract  her cervical in sitting due to increase pain in left arm but able to do in supine.  Patient has positive neural tension in left radial nerve. Patient will benefit from skilled therapy to reduce left upper extremity symptoms and reduce cervical pain in upright position.     History and Personal Factors relevant to plan of care:  diabetes    Clinical Presentation  Stable    Clinical Presentation due to:  stable codition    Clinical Decision Making  Low    Rehab Potential  Excellent    PT Frequency  2x / week    PT Duration  6 weeks    PT Treatment/Interventions  Cryotherapy;Electrical Stimulation;Moist Heat;Traction;Therapeutic exercise;Therapeutic activities;Neuromuscular re-education;Patient/family education;Manual techniques;Passive range of motion;Dry needling    PT Next Visit Plan  cervical traction; mobilization to upper thoracic; dry needling to left cervical and interscapular, neural tension stretch to left upper extremity; posture    PT Home Exercise Plan  Access Code: [email or web address]    Consulted and Agree with Plan of Care  Patient       Patient will benefit from skilled therapeutic intervention in order to improve the following deficits and impairments:  Pain, Increased fascial restricitons, Decreased mobility, Increased muscle spasms, Decreased activity tolerance, Decreased range of motion, Decreased strength  Visit Diagnosis: Cervicalgia - Plan: PT plan of care cert/re-cert  Muscle weakness (generalized) - Plan: PT plan of care cert/re-cert  Cramp and spasm - Plan: PT plan of care cert/re-cert     Problem List Patient Active Problem List   Diagnosis Date Noted  . Urticaria 08/14/2017  . Seasonal and perennial allergic rhinitis 08/14/2017  . Allergic reaction 08/14/2017  . Type II or unspecified type diabetes mellitus without mention of complication, uncontrolled 03/09/2013  . Bunion 03/09/2013  . Plantar fasciitis, bilateral 03/09/2013  . Unspecified vitamin D deficiency  08/31/2012  . Type II or unspecified type diabetes mellitus without mention of complication, not stated as uncontrolled 08/31/2012  . HYPERLIPIDEMIA 08/05/2010  . ANGIOEDEMA 08/05/2010  . Discoid lupus 06/12/2010  . Obesity 06/12/2010  . Diabetes mellitus 10/31/2009    Earlie Counts, PT 01/21/18 5:10 PM   [facility] [address] [place], [place], [address] Phone: [phone]   Fax:  [phone]  Name: Theresa Meyers MRN: [id number] Date of Birth: 02-Nov-1955

## 2018-01-21 NOTE — Patient Instructions (Signed)
Access Code: [email or web address]  URL: [email or web address]  Date: 01/21/2018  Prepared by: Earlie Counts   Exercises  Supine Chin Tuck - 5 reps - 1 sets - 5 sec hold - 2x daily - 7x week [place] [address], [place],  [address] Phone # [phone] Fax [phone]

## 2018-01-23 ENCOUNTER — Ambulatory Visit: Payer: 59 | Admitting: Physical Therapy

## 2018-01-23 DIAGNOSIS — M6281 Muscle weakness (generalized): Secondary | ICD-10-CM

## 2018-01-23 DIAGNOSIS — M542 Cervicalgia: Secondary | ICD-10-CM

## 2018-01-23 DIAGNOSIS — R252 Cramp and spasm: Secondary | ICD-10-CM

## 2018-01-23 NOTE — Patient Instructions (Signed)
Access Code: [email or web address]  URL: [email or web address]  Date: 01/23/2018  Prepared by: Ruben Im   Exercises  Supine Chin Tuck - 5 reps - 1 sets - 5 sec hold - 2x daily - 7x weekly  Median Nerve Flossing - Tray - 10 reps - 1 sets - 1x daily - 7x weekly  Wall Push Up with Plus - 10 reps - 1 sets - 1x daily - 7x weekly   Hold on wall push ups secondary to production of numbness/tingling

## 2018-01-23 NOTE — Therapy (Signed)
[facility] [address], [place], [place], [address] Phone: [phone]   Fax:  [phone]  Physical Therapy Treatment  Patient Details  Name: Theresa Meyers MRN: [id number] Date of Birth: 1955/06/03 Referring Provider (PT): Dr, Jovita Gamma   Encounter Date: 01/23/2018  PT End of Session - 01/23/18 1019    Visit Number  2    Date for PT Re-Evaluation  03/04/18    Authorization Type  UMR    PT Start Time  0930    PT Stop Time  1025    PT Time Calculation (min)  55 min    Activity Tolerance  Patient tolerated treatment well       Past Medical History:  Diagnosis Date  . Anemia   . Colon polyp   . Diabetes mellitus   . Discoid lupus    Dr. Abner Greenspan  . Hypercholesteremia   . Vitamin D deficiency     Past Surgical History:  Procedure Laterality Date  . CARPAL TUNNEL RELEASE    . FOOT SURGERY      There were no vitals filed for this visit.  Subjective Assessment - 01/23/18 0930    Subjective  Feeling OK.  About the same.  Numbness in index and middle fingers.  Left upper trap pain 5/10.      Currently in Pain?  Yes    Pain Score  5     Pain Location  Neck                       [facility] Adult PT Treatment/Exercise - 01/23/18 0001      Neck Exercises: Standing   Wall Push Ups  5 reps    Wall Push Ups Limitations  discontinued secondary to increased numbness/tingling     Other Standing Exercises  nerve gliding with head movements 10x      Neck Exercises: Supine   Neck Retraction  10 reps      Moist Heat Therapy   Number Minutes Moist Heat  10 Minutes    Moist Heat Location  Cervical      Electrical Stimulation   Electrical Stimulation Location  cervical and scapular region left    Electrical Stimulation Action  IFC    Electrical Stimulation Parameters  5 ma 10 min supine    Electrical Stimulation Goals  Pain      Traction   Type of Traction  Cervical    Min (lbs)  5    Max  (lbs)  10    Hold Time  45    Rest Time  15    Time  12      Manual Therapy   Manual therapy comments  left UE neural glides 15x no change in UE symptoms     Joint Mobilization  C7-T1 PA grade 3 5x; lateral glides right/left C4-7 grade 2/3 5x each level     Soft tissue mobilization  with left upper trap trigger point compression she reports increased UE numbness/tingling    Manual Traction  3x 15 sec holds no immediate changes             PT Education - 01/23/18 1013    Education Details   Access Code: [email or web address]  nerve flossing    Person(s) Educated  Patient    Methods  Explanation;Demonstration;Handout    Comprehension  Returned demonstration;Verbalized understanding       PT Short Term Goals - 01/21/18 1705  PT SHORT TERM GOAL #1   Title  independent with intial HEP    Time  3    Period  Weeks    Status  New    Target Date  02/11/18      PT SHORT TERM GOAL #2   Title  cervical pain decreased >/= 25%     Time  3    Period  Weeks    Status  New    Target Date  02/11/18        PT Long Term Goals - 01/21/18 1706      PT LONG TERM GOAL #1   Title  independent with HEP and understand how to progress herself    Time  6    Period  Weeks    Status  New    Target Date  03/04/18      PT LONG TERM GOAL #2   Title  cervical pain in sitting and standing decreased >/= 60%     Time  6    Period  Weeks    Status  New    Target Date  03/04/18      PT LONG TERM GOAL #3   Title  cervical ROM improved so she is able to turn her head to look behind due to increased mobility    Time  6    Period  Weeks    Status  New    Target Date  03/04/18      PT LONG TERM GOAL #4   Title  ability to type on the computer without difficulty due to decreased numbness in the left fingers    Time  6    Period  Weeks    Status  New    Target Date  03/04/18            Plan - 01/23/18 1020    Clinical Impression Statement  Corrected technique with supine cervical  retractions.  No immediate changes with manual therapy or cervical traction.  No change in symptoms with neural tension.  Trigger point compression on left upper trap and levator does increase distal symptoms as well as wall push ups.  Will hold on wall push up exercise for now.  She may benefit from soft tissue mobilization to this area and dry needling.  Therapist closely monitoring response with all treatment interventions.      Rehab Potential  Excellent    PT Frequency  2x / week    PT Duration  6 weeks    PT Treatment/Interventions  Cryotherapy;Electrical Stimulation;Moist Heat;Traction;Therapeutic exercise;Therapeutic activities;Neuromuscular re-education;Patient/family education;Manual techniques;Passive range of motion;Dry needling    PT Next Visit Plan  assess response to cervical traction; mobilization to upper thoracic; patient open to trying dry needling to left cervical and interscapular next visit, neural tension stretch to left upper extremity; posture;  assess response to ES/heat    PT Home Exercise Plan  Access Code: [email or web address]    Consulted and Agree with Plan of Care  Patient       Patient will benefit from skilled therapeutic intervention in order to improve the following deficits and impairments:  Pain, Increased fascial restricitons, Decreased mobility, Increased muscle spasms, Decreased activity tolerance, Decreased range of motion, Decreased strength  Visit Diagnosis: Cervicalgia  Muscle weakness (generalized)  Cramp and spasm     Problem List Patient Active Problem List   Diagnosis Date Noted  . Urticaria 08/14/2017  . Seasonal and perennial allergic rhinitis 08/14/2017  . Allergic reaction  08/14/2017  . Type II or unspecified type diabetes mellitus without mention of complication, uncontrolled 03/09/2013  . Bunion 03/09/2013  . Plantar fasciitis, bilateral 03/09/2013  . Unspecified vitamin D deficiency 08/31/2012  . Type II or unspecified type diabetes  mellitus without mention of complication, not stated as uncontrolled 08/31/2012  . HYPERLIPIDEMIA 08/05/2010  . ANGIOEDEMA 08/05/2010  . Discoid lupus 06/12/2010  . Obesity 06/12/2010  . Diabetes mellitus 10/31/2009   Ruben Im, PT 01/23/18 10:35 AM Phone: [phone] Fax: [phone]  Alvera Singh 01/23/2018, 10:34 AM  [facility] [address] [place], [place], [address] Phone: [phone]   Fax:  [phone]  Name: Theresa Meyers MRN: [id number] Date of Birth: 05-28-1955

## 2018-01-27 ENCOUNTER — Ambulatory Visit: Payer: 59 | Admitting: Physical Therapy

## 2018-01-27 ENCOUNTER — Encounter: Payer: Self-pay | Admitting: Physical Therapy

## 2018-01-27 DIAGNOSIS — R252 Cramp and spasm: Secondary | ICD-10-CM

## 2018-01-27 DIAGNOSIS — M542 Cervicalgia: Secondary | ICD-10-CM | POA: Diagnosis not present

## 2018-01-27 DIAGNOSIS — M6281 Muscle weakness (generalized): Secondary | ICD-10-CM | POA: Diagnosis not present

## 2018-01-27 NOTE — Patient Instructions (Signed)

## 2018-01-27 NOTE — Therapy (Signed)
[facility] [address] [place], [place], [address] Phone: [phone]   Fax:  [phone]  Physical Therapy Treatment  Patient Details  Name: Theresa Meyers MRN: [id number] Date of Birth: 06/22/1955 Referring Provider (PT): Dr, Jovita Gamma   Encounter Date: 01/27/2018  PT End of Session - 01/27/18 0940    Visit Number  3    Date for PT Re-Evaluation  03/04/18    Authorization Type  UMR    PT Start Time  0930    PT Stop Time  1030    PT Time Calculation (min)  60 min    Activity Tolerance  Patient tolerated treatment well    Behavior During Therapy  [facility] for tasks assessed/performed       Past Medical History:  Diagnosis Date  . Anemia   . Colon polyp   . Diabetes mellitus   . Discoid lupus    Dr. Abner Greenspan  . Hypercholesteremia   . Vitamin D deficiency     Past Surgical History:  Procedure Laterality Date  . CARPAL TUNNEL RELEASE    . FOOT SURGERY      There were no vitals filed for this visit.  Subjective Assessment - 01/27/18 0938    Subjective  left middle finger feels a little less numb. No change in the left upper trap pain.     Patient Stated Goals  avoid surgery,     Currently in Pain?  Yes    Pain Score  5     Pain Location  Neck    Pain Orientation  Left    Pain Descriptors / Indicators  Tingling    Pain Type  Acute pain    Pain Radiating Towards  radiating into left upper extremity    Pain Onset  More than a month ago    Pain Frequency  Intermittent    Aggravating Factors   sitting, standing, typing    Pain Relieving Factors  bring left arm over head    Multiple Pain Sites  No                       [facility] Adult PT Treatment/Exercise - 01/27/18 0001      Modalities   Modalities  Traction;Electrical Stimulation;Moist Heat      Moist Heat Therapy   Number Minutes Moist Heat  15 Minutes    Moist Heat Location  Cervical      Electrical Stimulation   Electrical  Stimulation Location  cervical and scapular region left    Electrical Stimulation Action  IFC    Electrical Stimulation Parameters  15 min; to patient tolerance    Electrical Stimulation Goals  Pain      Traction   Type of Traction  Cervical    Min (lbs)  5    Max (lbs)  18    Hold Time  45    Rest Time  15    Time  12      Manual Therapy   Manual Therapy  Soft tissue mobilization    Soft tissue mobilization  cervical paraspinals, upper trap, left interscapular area; suboccipitals       Trigger Point Dry Needling - 01/27/18 1021    Consent Given?  Yes    Education Handout Provided  Yes    Muscles Treated Upper Body  Upper trapezius;Levator scapulae   left C5, left T1-2 multifidi          PT  Education - 01/27/18 1022    Education Details  information on dry needling    Person(s) Educated  Patient    Methods  Explanation;Handout    Comprehension  Verbalized understanding       PT Short Term Goals - 01/27/18 1025      PT SHORT TERM GOAL #1   Title  independent with intial HEP    Time  3    Period  Weeks    Status  On-going      PT SHORT TERM GOAL #2   Title  cervical pain decreased >/= 25%     Time  3    Period  Weeks    Status  On-going        PT Long Term Goals - 01/21/18 1706      PT LONG TERM GOAL #1   Title  independent with HEP and understand how to progress herself    Time  6    Period  Weeks    Status  New    Target Date  03/04/18      PT LONG TERM GOAL #2   Title  cervical pain in sitting and standing decreased >/= 60%     Time  6    Period  Weeks    Status  New    Target Date  03/04/18      PT LONG TERM GOAL #3   Title  cervical ROM improved so she is able to turn her head to look behind due to increased mobility    Time  6    Period  Weeks    Status  New    Target Date  03/04/18      PT LONG TERM GOAL #4   Title  ability to type on the computer without difficulty due to decreased numbness in the left fingers    Time  6     Period  Weeks    Status  New    Target Date  03/04/18            Plan - 01/27/18 0941    Clinical Impression Statement  Patient reports less numbness in left middle finger.  Patient has increased pain in left cerivcal and tingling in left upper extremity after dry needling.  Patient will benefit from therapist closely monitoring her symptoms with treatment to reduce pain and parathesias.     Rehab Potential  Excellent    PT Frequency  2x / week    PT Duration  6 weeks    PT Treatment/Interventions  Cryotherapy;Electrical Stimulation;Moist Heat;Traction;Therapeutic exercise;Therapeutic activities;Neuromuscular re-education;Patient/family education;Manual techniques;Passive range of motion;Dry needling    PT Next Visit Plan  cervical traction; mobilization to upper thoracic; assess  dry needling to left cervical and interscapular next visit, neural tension stretch to left upper extremity; posture;  assess response to ES/heat    PT Home Exercise Plan  Access Code: [email or web address]    Consulted and Agree with Plan of Care  Patient       Patient will benefit from skilled therapeutic intervention in order to improve the following deficits and impairments:  Pain, Increased fascial restricitons, Decreased mobility, Increased muscle spasms, Decreased activity tolerance, Decreased range of motion, Decreased strength  Visit Diagnosis: Cervicalgia  Muscle weakness (generalized)  Cramp and spasm     Problem List Patient Active Problem List   Diagnosis Date Noted  . Urticaria 08/14/2017  . Seasonal and perennial allergic rhinitis 08/14/2017  . Allergic reaction 08/14/2017  .  Type II or unspecified type diabetes mellitus without mention of complication, uncontrolled 03/09/2013  . Bunion 03/09/2013  . Plantar fasciitis, bilateral 03/09/2013  . Unspecified vitamin D deficiency 08/31/2012  . Type II or unspecified type diabetes mellitus without mention of complication, not stated as uncontrolled  08/31/2012  . HYPERLIPIDEMIA 08/05/2010  . ANGIOEDEMA 08/05/2010  . Discoid lupus 06/12/2010  . Obesity 06/12/2010  . Diabetes mellitus 10/31/2009    Earlie Counts, PT 01/27/18 10:27 AM   [facility] [address] [place], [place], [address] Phone: [phone]   Fax:  [phone]  Name: Theresa Meyers MRN: [id number] Date of Birth: [birth year]

## 2018-01-28 DIAGNOSIS — G5602 Carpal tunnel syndrome, left upper limb: Secondary | ICD-10-CM | POA: Diagnosis not present

## 2018-01-30 ENCOUNTER — Encounter: Payer: Self-pay | Admitting: Physical Therapy

## 2018-01-30 ENCOUNTER — Ambulatory Visit: Payer: 59 | Admitting: Physical Therapy

## 2018-01-30 DIAGNOSIS — R252 Cramp and spasm: Secondary | ICD-10-CM

## 2018-01-30 DIAGNOSIS — M542 Cervicalgia: Secondary | ICD-10-CM | POA: Diagnosis not present

## 2018-01-30 DIAGNOSIS — M6281 Muscle weakness (generalized): Secondary | ICD-10-CM

## 2018-01-30 NOTE — Therapy (Signed)
[facility] [address] [place], [place], [address] Phone: [phone]   Fax:  [phone]  Physical Therapy Treatment  Patient Details  Name: Theresa Meyers MRN: [id number] Date of Birth: 11/06/1955 Referring Provider (PT): Dr, Jovita Gamma   Encounter Date: 01/30/2018  PT End of Session - 01/30/18 1451    Visit Number  4    Date for PT Re-Evaluation  03/04/18    Authorization Type  UMR    PT Start Time  3354    PT Stop Time  1540    PT Time Calculation (min)  55 min    Activity Tolerance  Patient tolerated treatment well    Behavior During Therapy  [facility] for tasks assessed/performed       Past Medical History:  Diagnosis Date  . Anemia   . Colon polyp   . Diabetes mellitus   . Discoid lupus    Dr. Abner Greenspan  . Hypercholesteremia   . Vitamin D deficiency     Past Surgical History:  Procedure Laterality Date  . CARPAL TUNNEL RELEASE    . FOOT SURGERY      There were no vitals filed for this visit.  Subjective Assessment - 01/30/18 1448    Subjective  I am having left carpal tunnel surgery tomorrow. The traction with more pull did not tell due to increased pain in in my left hand. The neck pain has not increased.     Patient Stated Goals  avoid surgery,     Currently in Pain?  Yes    Pain Score  5     Pain Location  Neck    Pain Orientation  Left    Pain Descriptors / Indicators  Tingling    Pain Type  Acute pain    Pain Radiating Towards  radiating into the left upper extremity    Pain Onset  More than a month ago    Aggravating Factors   sitting, standing, typing    Pain Relieving Factors  bring left arm over head    Multiple Pain Sites  No                       [facility] Adult PT Treatment/Exercise - 01/30/18 0001      Moist Heat Therapy   Moist Heat Location  Cervical      Traction   Type of Traction  Cervical    Min (lbs)  5    Max (lbs)  18    Hold Time  45    Rest Time   15    Time  12      Manual Therapy   Manual Therapy  Soft tissue mobilization;Joint mobilization    Manual therapy comments  left UE neural glides 15x no change in UE symptoms     Joint Mobilization  C7-T1 PA grade 3 5x; lateral glides right/left C4-7 grade 2/3 5x each level     Soft tissue mobilization  cervical paraspinals, upper trap, left interscapular area; suboccipitals, left shoulder, pectoralis, subscapularis; Manually stretched right pectoralis in all directions               PT Short Term Goals - 01/30/18 1525      PT SHORT TERM GOAL #1   Title  independent with intial HEP    Time  3    Period  Weeks    Status  Achieved      PT SHORT TERM  GOAL #2   Title  cervical pain decreased >/= 25%     Time  3    Period  Weeks    Status  On-going        PT Long Term Goals - 01/21/18 1706      PT LONG TERM GOAL #1   Title  independent with HEP and understand how to progress herself    Time  6    Period  Weeks    Status  New    Target Date  03/04/18      PT LONG TERM GOAL #2   Title  cervical pain in sitting and standing decreased >/= 60%     Time  6    Period  Weeks    Status  New    Target Date  03/04/18      PT LONG TERM GOAL #3   Title  cervical ROM improved so she is able to turn her head to look behind due to increased mobility    Time  6    Period  Weeks    Status  New    Target Date  03/04/18      PT LONG TERM GOAL #4   Title  ability to type on the computer without difficulty due to decreased numbness in the left fingers    Time  6    Period  Weeks    Status  New    Target Date  03/04/18            Plan - 01/30/18 1451    Clinical Impression Statement  Patient is having carpal tunnel surgery tomorrow so we did not do strengthening today due to this.  Patient had no pain after manual work. Patient had full endrange cervical P/ROM  for rotation and sidebending. Patient had increased elongation of the pectoralis muscle on the left after  manual work. After carpal tunnel surgery we will be able to tell what pain is coming from her neck or the carpal tunnel.  Patient will benefit from skilled therapy to redcue pain and imrprove function while monitoring symptoms.     Rehab Potential  Excellent    PT Frequency  2x / week    PT Duration  6 weeks    PT Treatment/Interventions  Cryotherapy;Electrical Stimulation;Moist Heat;Traction;Therapeutic exercise;Therapeutic activities;Neuromuscular re-education;Patient/family education;Manual techniques;Passive range of motion;Dry needling    PT Next Visit Plan  cervical traction; mobilization to upper thoracic; neural tension stretch to left upper extremity; posture;  see how she did with carpal tunnel surgery    PT Home Exercise Plan  Access Code: [email or web address]    Consulted and Agree with Plan of Care  Patient       Patient will benefit from skilled therapeutic intervention in order to improve the following deficits and impairments:  Pain, Increased fascial restricitons, Decreased mobility, Increased muscle spasms, Decreased activity tolerance, Decreased range of motion, Decreased strength  Visit Diagnosis: Cervicalgia  Muscle weakness (generalized)  Cramp and spasm     Problem List Patient Active Problem List   Diagnosis Date Noted  . Urticaria 08/14/2017  . Seasonal and perennial allergic rhinitis 08/14/2017  . Allergic reaction 08/14/2017  . Type II or unspecified type diabetes mellitus without mention of complication, uncontrolled 03/09/2013  . Bunion 03/09/2013  . Plantar fasciitis, bilateral 03/09/2013  . Unspecified vitamin D deficiency 08/31/2012  . Type II or unspecified type diabetes mellitus without mention of complication, not stated as uncontrolled 08/31/2012  . HYPERLIPIDEMIA 08/05/2010  .  ANGIOEDEMA 08/05/2010  . Discoid lupus 06/12/2010  . Obesity 06/12/2010  . Diabetes mellitus 10/31/2009    Earlie Counts, PT 01/30/18 3:26 PM    [facility] [address] [place], [place], [address] Phone: [phone]   Fax:  [phone]  Name: LAYLAMARIE MEUSER MRN: [id number] Date of Birth: 1956/04/01

## 2018-01-31 DIAGNOSIS — G5602 Carpal tunnel syndrome, left upper limb: Secondary | ICD-10-CM | POA: Diagnosis not present

## 2018-01-31 MED FILL — oxyCODONE HCL 5 MG TABS: 5 | 2 days supply | Qty: 10 | Fill #0

## 2018-02-04 ENCOUNTER — Ambulatory Visit: Payer: 59 | Attending: Neurosurgery | Admitting: Physical Therapy

## 2018-02-04 ENCOUNTER — Encounter: Payer: Self-pay | Admitting: Physical Therapy

## 2018-02-04 DIAGNOSIS — R252 Cramp and spasm: Secondary | ICD-10-CM | POA: Insufficient documentation

## 2018-02-04 DIAGNOSIS — M6281 Muscle weakness (generalized): Secondary | ICD-10-CM | POA: Insufficient documentation

## 2018-02-04 DIAGNOSIS — M542 Cervicalgia: Secondary | ICD-10-CM | POA: Diagnosis not present

## 2018-02-04 NOTE — Therapy (Signed)
[facility] [address] [place], [place], [address] Phone: [phone]   Fax:  [phone]  Physical Therapy Treatment  Patient Details  Name: Theresa Meyers MRN: [id number] Date of Birth: 1955-09-25 Referring Provider (PT): Dr, Jovita Gamma   Encounter Date: 02/04/2018  PT End of Session - 02/04/18 0922    Visit Number  5    Date for PT Re-Evaluation  03/04/18    Authorization Type  UMR    PT Start Time  0850    PT Stop Time  0930    PT Time Calculation (min)  40 min    Activity Tolerance  Patient tolerated treatment well       Past Medical History:  Diagnosis Date  . Anemia   . Colon polyp   . Diabetes mellitus   . Discoid lupus    Dr. Abner Greenspan  . Hypercholesteremia   . Vitamin D deficiency     Past Surgical History:  Procedure Laterality Date  . CARPAL TUNNEL RELEASE    . FOOT SURGERY      There were no vitals filed for this visit.  Subjective Assessment - 02/04/18 0853    Subjective  Had left carpal tunnel surgery on Friday.  No restrictions.  Tingling in forearm and upper trap.  I don't know if it's making a difference but traction feels good while on.   Sometimes resting my left arm over the top of my head helps.     Currently in Pain?  Yes    Pain Score  1     Pain Type  Acute pain                       [facility] Adult PT Treatment/Exercise - 02/04/18 0001      Neck Exercises: Seated   Other Seated Exercise  right cervical sidebending 5x reports worsening of left UE symptoms     Other Seated Exercise  left cervical sidebending with reports of worsening of left UE tingling       Neck Exercises: Supine   Neck Retraction  5 reps   discontinued secondary to worsening of left UE tingling    Other Supine Exercise  scapular retractions 10x      Traction   Type of Traction  Cervical    Min (lbs)  5    Max (lbs)  18    Hold Time  45    Rest Time  15    Time  12      Manual  Therapy   Joint Mobilization  C7-T1 PA grade 3 5x; lateral glides right/left C4-7 grade 2/3 5x each level     Soft tissue mobilization  contract relax left upper trap 3x 5 sec                PT Short Term Goals - 01/30/18 1525      PT SHORT TERM GOAL #1   Title  independent with intial HEP    Time  3    Period  Weeks    Status  Achieved      PT SHORT TERM GOAL #2   Title  cervical pain decreased >/= 25%     Time  3    Period  Weeks    Status  On-going        PT Long Term Goals - 01/21/18 1706      PT LONG TERM GOAL #1   Title  independent with HEP and understand how to progress herself    Time  6    Period  Weeks    Status  New    Target Date  03/04/18      PT LONG TERM GOAL #2   Title  cervical pain in sitting and standing decreased >/= 60%     Time  6    Period  Weeks    Status  New    Target Date  03/04/18      PT LONG TERM GOAL #3   Title  cervical ROM improved so she is able to turn her head to look behind due to increased mobility    Time  6    Period  Weeks    Status  New    Target Date  03/04/18      PT LONG TERM GOAL #4   Title  ability to type on the computer without difficulty due to decreased numbness in the left fingers    Time  6    Period  Weeks    Status  New    Target Date  03/04/18            Plan - 02/04/18 0865    Clinical Impression Statement  The patient reports she is recovering well from carpal tunnel surgery on left on 11/1.  She reports she has no restrictions and is returning to work today.  No directional preference with repeated movement testing of the cervical spine.  Tenderness with PA and lateral glides at C4,5,6 levels on left.  She reports traction has provided some temporary relief during and immediately following.  Therapist closely monitoring response with all treatment interventions.      Rehab Potential  Excellent    PT Frequency  2x / week    PT Treatment/Interventions  Cryotherapy;Electrical  Stimulation;Moist Heat;Traction;Therapeutic exercise;Therapeutic activities;Neuromuscular re-education;Patient/family education;Manual techniques;Passive range of motion;Dry needling    PT Next Visit Plan  cervical traction; mobilization to upper thoracic; neural tension stretch to left upper extremity; posture;  modalities as needed;  carpal tunnel surgery 11/1 (no restrictions)    PT Home Exercise Plan  Access Code: [email or web address]       Patient will benefit from skilled therapeutic intervention in order to improve the following deficits and impairments:  Pain, Increased fascial restricitons, Decreased mobility, Increased muscle spasms, Decreased activity tolerance, Decreased range of motion, Decreased strength  Visit Diagnosis: Cervicalgia  Muscle weakness (generalized)  Cramp and spasm     Problem List Patient Active Problem List   Diagnosis Date Noted  . Urticaria 08/14/2017  . Seasonal and perennial allergic rhinitis 08/14/2017  . Allergic reaction 08/14/2017  . Type II or unspecified type diabetes mellitus without mention of complication, uncontrolled 03/09/2013  . Bunion 03/09/2013  . Plantar fasciitis, bilateral 03/09/2013  . Unspecified vitamin D deficiency 08/31/2012  . Type II or unspecified type diabetes mellitus without mention of complication, not stated as uncontrolled 08/31/2012  . HYPERLIPIDEMIA 08/05/2010  . ANGIOEDEMA 08/05/2010  . Discoid lupus 06/12/2010  . Obesity 06/12/2010  . Diabetes mellitus 10/31/2009   Ruben Im, PT 02/04/18 9:35 AM Phone: [phone] Fax: [phone]  Alvera Singh 02/04/2018, 9:34 AM  [facility] [address] [place], [place], [address] Phone: [phone]   Fax:  [phone]  Name: SHARDAY MICHL MRN: [id number] Date of Birth: [birth year]

## 2018-02-06 ENCOUNTER — Ambulatory Visit: Payer: 59 | Admitting: Physical Therapy

## 2018-02-06 ENCOUNTER — Encounter: Payer: Self-pay | Admitting: Physical Therapy

## 2018-02-06 DIAGNOSIS — M6281 Muscle weakness (generalized): Secondary | ICD-10-CM

## 2018-02-06 DIAGNOSIS — R252 Cramp and spasm: Secondary | ICD-10-CM

## 2018-02-06 DIAGNOSIS — M542 Cervicalgia: Secondary | ICD-10-CM

## 2018-02-06 NOTE — Therapy (Signed)
[facility] [address], [place], [place], [address] Phone: [phone]   Fax:  [phone]  Physical Therapy Treatment  Patient Details  Name: Theresa Meyers MRN: [id number] Date of Birth: November 24, 1955 Referring Provider (PT): Dr, Jovita Gamma   Encounter Date: 02/06/2018  PT End of Session - 02/06/18 0837    Visit Number  6    Date for PT Re-Evaluation  03/04/18    Authorization Type  UMR    PT Start Time  0811    PT Stop Time  0855    PT Time Calculation (min)  44 min    Activity Tolerance  Patient tolerated treatment well    Behavior During Therapy  [facility] for tasks assessed/performed       Past Medical History:  Diagnosis Date  . Anemia   . Colon polyp   . Diabetes mellitus   . Discoid lupus    Dr. Abner Greenspan  . Hypercholesteremia   . Vitamin D deficiency     Past Surgical History:  Procedure Laterality Date  . CARPAL TUNNEL RELEASE    . FOOT SURGERY      There were no vitals filed for this visit.  Subjective Assessment - 02/06/18 0816    Subjective  The neck feels fine. The left shoulder feels fine.     Patient Stated Goals  avoid surgery,     Currently in Pain?  No/denies    Multiple Pain Sites  No                       [facility] Adult PT Treatment/Exercise - 02/06/18 0001      Exercises   Exercises  Neck      Neck Exercises: Supine   Other Supine Exercise  supine on foam roll with alternating shoulder flexion; Y motion of arms, scapula retraction and protraction      Modalities   Modalities  Traction      Traction   Type of Traction  Cervical    Min (lbs)  5    Max (lbs)  18    Hold Time  45    Rest Time  15    Time  12      Manual Therapy   Manual Therapy  Soft tissue mobilization;Joint mobilization    Manual therapy comments  left neural stretch with no wrist movement    Joint Mobilization  C7-T1 PA and rotational grade 3 5x; lateral glides right/left C4-7 grade 2/3 5x  each level     Soft tissue mobilization  left cervical, left pectoralis, left shoulder girdle             PT Education - 02/06/18 0842    Education Details  information on scar massage    Person(s) Educated  Patient    Methods  Explanation;Handout    Comprehension  Verbalized understanding       PT Short Term Goals - 02/06/18 0838      PT SHORT TERM GOAL #2   Title  cervical pain decreased >/= 25%     Time  3    Period  Weeks    Status  Achieved        PT Long Term Goals - 02/06/18 6160      PT LONG TERM GOAL #1   Title  independent with HEP and understand how to progress herself    Time  6    Period  Weeks  Status  On-going      PT LONG TERM GOAL #2   Title  cervical pain in sitting and standing decreased >/= 60%     Time  6    Period  Weeks    Status  On-going      PT LONG TERM GOAL #3   Title  cervical ROM improved so she is able to turn her head to look behind due to increased mobility    Time  6    Period  Weeks    Status  On-going      PT LONG TERM GOAL #4   Title  ability to type on the computer without difficulty due to decreased numbness in the left fingers    Time  6    Period  Weeks    Status  On-going            Plan - 02/06/18 0839    Clinical Impression Statement  Cervical pain in a relaxed position decreased by 75%.  Patient upper arm still has some tingling but not as much. Patient can reduce tingling in left arm with cervical retraction and extension. Patient still has decreased mobility in C7-T1.  Patient still responds well with cervical traction. Patient will benefit from skilled therapy to decrease pain and tingling.     Rehab Potential  Excellent    PT Frequency  2x / week    PT Duration  6 weeks    PT Treatment/Interventions  Cryotherapy;Electrical Stimulation;Moist Heat;Traction;Therapeutic exercise;Therapeutic activities;Neuromuscular re-education;Patient/family education;Manual techniques;Passive range of motion;Dry  needling    PT Next Visit Plan  cervical traction; mobilization to upper thoracic; neural tension stretch to left upper extremity; posture;  modalities as needed;  carpal tunnel surgery 11/1 (no restrictions)    PT Home Exercise Plan  Access Code: [email or web address]    Consulted and Agree with Plan of Care  Patient       Patient will benefit from skilled therapeutic intervention in order to improve the following deficits and impairments:  Pain, Increased fascial restricitons, Decreased mobility, Increased muscle spasms, Decreased activity tolerance, Decreased range of motion, Decreased strength  Visit Diagnosis: Cervicalgia  Muscle weakness (generalized)  Cramp and spasm     Problem List Patient Active Problem List   Diagnosis Date Noted  . Urticaria 08/14/2017  . Seasonal and perennial allergic rhinitis 08/14/2017  . Allergic reaction 08/14/2017  . Type II or unspecified type diabetes mellitus without mention of complication, uncontrolled 03/09/2013  . Bunion 03/09/2013  . Plantar fasciitis, bilateral 03/09/2013  . Unspecified vitamin D deficiency 08/31/2012  . Type II or unspecified type diabetes mellitus without mention of complication, not stated as uncontrolled 08/31/2012  . HYPERLIPIDEMIA 08/05/2010  . ANGIOEDEMA 08/05/2010  . Discoid lupus 06/12/2010  . Obesity 06/12/2010  . Diabetes mellitus 10/31/2009    Earlie Counts, PT 02/06/18 8:44 AM   [facility] [address], [place], [place], [address] Phone: [phone]   Fax:  [phone]  Name: ABBIGAL RADICH MRN: [id number] Date of Birth: [birth year]

## 2018-02-06 NOTE — Patient Instructions (Signed)
Scar Massage  Scar massage is done to improve the mobility of scar, decrease scar tissue from building up, reduce adhesions, and prevent Keloids from forming. Start scar massage after scabs have fallen off by themselves and no open areas. The first few weeks after surgery, it is normal for a scar to appear pink or red and slightly raised. Scars can itch or have areas of numbness. Some scars may be sensitive.   Direct Scar massage: after scar is healed, no opening, no scab 1.  Place pads of two fingers together directly on the scar starting at one end of the scar. Move the fingers up and down across the scar holding 5 seconds one direction.  Then go opposite direction hold 5 seconds.  2. Move over to the next section of the scar and repeat.  Work your way along the entire length of the scar.   3. Next make diagonal movements along the scar holding 5 seconds at one direction. 4. Next movement is side to side. 5. Do not rub fingers over the scar.  Instead keep firm pressure and move scar over the tissue it is on top   Scar Lift and Roll 12 weeks after surgery. 1. Pinch a small amount of the scar between your first two fingers and thumb.  2. Roll the scar between your fingers for 5 to 15 seconds. 3. Move along the scar and repeat until you have massaged the entire length of scar.   Stop the massage and call your doctor if you notice: 1. Increased redness 2. Bleeding from scar 3. Seepage coming from the scar 4. Scar is warmer and has increased pain   [facility] [address], [address] [place], [place] [address] Phone # [phone] Fax [phone]  

## 2018-02-11 ENCOUNTER — Encounter: Payer: Self-pay | Admitting: Physical Therapy

## 2018-02-11 ENCOUNTER — Ambulatory Visit: Payer: 59 | Admitting: Physical Therapy

## 2018-02-11 DIAGNOSIS — R252 Cramp and spasm: Secondary | ICD-10-CM

## 2018-02-11 DIAGNOSIS — M6281 Muscle weakness (generalized): Secondary | ICD-10-CM

## 2018-02-11 DIAGNOSIS — M542 Cervicalgia: Secondary | ICD-10-CM

## 2018-02-11 NOTE — Therapy (Signed)
[facility] [address] [place], [place], [address] Phone: [phone]   Fax:  [phone]  Physical Therapy Treatment  Patient Details  Name: Theresa Meyers MRN: [id number] Date of Birth: 1956-01-31 Referring Provider (PT): Dr, Jovita Gamma   Encounter Date: 02/11/2018  PT End of Session - 02/11/18 0836    Visit Number  7    Date for PT Re-Evaluation  03/04/18    Authorization Type  UMR    PT Start Time  0806   ran late   PT Stop Time  0850    PT Time Calculation (min)  44 min    Activity Tolerance  Patient tolerated treatment well;No increased pain    Behavior During Therapy  WFL for tasks assessed/performed       Past Medical History:  Diagnosis Date  . Anemia   . Colon polyp   . Diabetes mellitus   . Discoid lupus    Dr. Abner Greenspan  . Hypercholesteremia   . Vitamin D deficiency     Past Surgical History:  Procedure Laterality Date  . CARPAL TUNNEL RELEASE    . FOOT SURGERY      There were no vitals filed for this visit.  Subjective Assessment - 02/11/18 0812    Subjective  I have increased tingling in left arm that started last Sunday. I see Dr. Grandville Silos tomorrow.     Patient Stated Goals  avoid surgery,     Currently in Pain?  Yes    Pain Score  8     Pain Location  Arm    Pain Orientation  Left    Pain Descriptors / Indicators  Tingling    Pain Type  Acute pain    Pain Onset  More than a month ago    Pain Frequency  Constant    Aggravating Factors   sitting, standing, typing    Pain Relieving Factors  bring left arm overhead    Multiple Pain Sites  No                       [facility] Adult PT Treatment/Exercise - 02/11/18 0001      Neck Exercises: Supine   Other Supine Exercise  supine on foam roll with alternating shoulder flexion; Y motion of arms, scapula retraction and protraction      Modalities   Modalities  Traction      Traction   Type of Traction  Cervical     Min (lbs)  5    Max (lbs)  18    Hold Time  45    Rest Time  15    Time  12      Manual Therapy   Manual Therapy  Soft tissue mobilization;Joint mobilization;Neural Stretch    Joint Mobilization  upslide glide and sideglide to C4-C6     Soft tissue mobilization  left cervical paraspinals, left upper trap, left interscapular, left subscapularis    Neural Stretch  left arm for brachial plexus stretch               PT Short Term Goals - 02/06/18 0838      PT SHORT TERM GOAL #2   Title  cervical pain decreased >/= 25%     Time  3    Period  Weeks    Status  Achieved        PT Long Term Goals - 02/11/18 8185  PT LONG TERM GOAL #1   Title  independent with HEP and understand how to progress herself    Time  6    Period  Weeks    Status  On-going      PT LONG TERM GOAL #2   Title  cervical pain in sitting and standing decreased >/= 60%     Time  6    Period  Weeks    Status  Achieved      PT LONG TERM GOAL #3   Title  cervical ROM improved so she is able to turn her head to look behind due to increased mobility    Time  6    Period  Weeks    Status  On-going      PT LONG TERM GOAL #4   Title  ability to type on the computer without difficulty due to decreased numbness in the left fingers    Time  6    Period  Weeks    Status  On-going            Plan - 02/11/18 0837    Clinical Impression Statement  Patient has had tingling in left upper extremity snce last sunday. Patient has trigger points in the left subscapularis and left cervical paraspinals C4-C6. After therapy patient reports no tingling. Patient will benefit from skilled therapy to decrease pain and tingling in left arm and cervical.     Rehab Potential  Excellent    PT Frequency  2x / week    PT Duration  6 weeks    PT Treatment/Interventions  Cryotherapy;Electrical Stimulation;Moist Heat;Traction;Therapeutic exercise;Therapeutic activities;Neuromuscular re-education;Patient/family  education;Manual techniques;Passive range of motion;Dry needling    PT Next Visit Plan   see what MD says; cervical traction; measure cervical ROM; mobilization to upper thoracic; neural tension stretch to left upper extremity; posture;  modalities as needed;  carpal tunnel surgery 11/1 (no restrictions)    PT Home Exercise Plan  Access Code: [email or web address]    Recommended Other Services  let Effie Berkshire know MD has not signed initial summary yet    Consulted and Agree with Plan of Care  Patient       Patient will benefit from skilled therapeutic intervention in order to improve the following deficits and impairments:  Pain, Increased fascial restricitons, Decreased mobility, Increased muscle spasms, Decreased activity tolerance, Decreased range of motion, Decreased strength  Visit Diagnosis: Cervicalgia  Muscle weakness (generalized)  Cramp and spasm     Problem List Patient Active Problem List   Diagnosis Date Noted  . Urticaria 08/14/2017  . Seasonal and perennial allergic rhinitis 08/14/2017  . Allergic reaction 08/14/2017  . Type II or unspecified type diabetes mellitus without mention of complication, uncontrolled 03/09/2013  . Bunion 03/09/2013  . Plantar fasciitis, bilateral 03/09/2013  . Unspecified vitamin D deficiency 08/31/2012  . Type II or unspecified type diabetes mellitus without mention of complication, not stated as uncontrolled 08/31/2012  . HYPERLIPIDEMIA 08/05/2010  . ANGIOEDEMA 08/05/2010  . Discoid lupus 06/12/2010  . Obesity 06/12/2010  . Diabetes mellitus 10/31/2009    Earlie Counts, PT 02/11/18 8:42 AM   [facility] [address] [place], [place], [address] Phone: [phone]   Fax:  [phone]  Name: Theresa Meyers MRN: [id number] Date of Birth: May 15, 1955

## 2018-02-12 DIAGNOSIS — G5602 Carpal tunnel syndrome, left upper limb: Secondary | ICD-10-CM | POA: Diagnosis not present

## 2018-02-12 MED FILL — MELOXICAM 15 MG TABLET: 15 | 30 days supply | Qty: 30 | Fill #0

## 2018-02-13 ENCOUNTER — Ambulatory Visit: Payer: 59 | Admitting: Physical Therapy

## 2018-02-13 ENCOUNTER — Encounter: Payer: Self-pay | Admitting: Physical Therapy

## 2018-02-13 DIAGNOSIS — M6281 Muscle weakness (generalized): Secondary | ICD-10-CM | POA: Diagnosis not present

## 2018-02-13 DIAGNOSIS — M542 Cervicalgia: Secondary | ICD-10-CM

## 2018-02-13 DIAGNOSIS — R252 Cramp and spasm: Secondary | ICD-10-CM

## 2018-02-13 NOTE — Therapy (Signed)
[facility] [address], [place], [place], [address] Phone: [phone]   Fax:  [phone]  Physical Therapy Treatment  Patient Details  Name: Theresa Meyers MRN: [id number] Date of Birth: [birth year] Referring Provider (PT): Dr, Jovita Gamma   Encounter Date: 02/13/2018  PT End of Session - 02/13/18 0828    Visit Number  8    Date for PT Re-Evaluation  03/04/18    Authorization Type  UMR    PT Start Time  0807   came late   PT Stop Time  0850    PT Time Calculation (min)  43 min    Activity Tolerance  Patient tolerated treatment well;No increased pain    Behavior During Therapy  WFL for tasks assessed/performed       Past Medical History:  Diagnosis Date  . Anemia   . Colon polyp   . Diabetes mellitus   . Discoid lupus    Dr. Abner Greenspan  . Hypercholesteremia   . Vitamin D deficiency     Past Surgical History:  Procedure Laterality Date  . CARPAL TUNNEL RELEASE    . FOOT SURGERY      There were no vitals filed for this visit.  Subjective Assessment - 02/13/18 0812    Subjective  I had some but it is not constant. No pain today.     Patient Stated Goals  avoid surgery,     Currently in Pain?  No/denies         [facility] PT Assessment - 02/13/18 0001      Assessment   Medical Diagnosis  M54.12 Cervical radiculopathy    Referring Provider (PT)  Dr, Jovita Gamma    Onset Date/Surgical Date  12/22/17    Hand Dominance  Right    Prior Therapy  none      Precautions   Precautions  None      Restrictions   Weight Bearing Restrictions  No      Home Environment   Living Environment  Private residence      Prior Function   Level of Independence  Independent    Vocation  Full time employment    Vocation Requirements  sitting, answer phones, turning head      Cognition   Overall Cognitive Status  Within Functional Limits for tasks assessed      Sensation   Light Touch  Impaired by gross  assessment      Posture/Postural Control   Posture/Postural Control  Postural limitations    Posture Comments  increased cervical thoracic kyphosis      Strength   Left Shoulder Flexion  5/5    Left Shoulder External Rotation  5/5      Palpation   Spinal mobility  decreased mobility of C5-T4    Palpation comment  no tenderness      Special Tests   Other special tests  improved neural tension in left upper extremity                   [facility] Adult PT Treatment/Exercise - 02/13/18 0001      Neck Exercises: Supine   Other Supine Exercise  supine on foam roll with alternating shoulder flexion; Y motion of arms, scapula retraction and protraction; horizontal shoulder abduction      Modalities   Modalities  Traction      Traction   Type of Traction  Cervical    Min (lbs)  5  Max (lbs)  18    Hold Time  45    Rest Time  15    Time  12      Manual Therapy   Manual Therapy  Neural Stretch    Neural Stretch  left arm for brachial plexus stretch               PT Short Term Goals - 02/06/18 0838      PT SHORT TERM GOAL #2   Title  cervical pain decreased >/= 25%     Time  3    Period  Weeks    Status  Achieved        PT Long Term Goals - 02/11/18 0840      PT LONG TERM GOAL #1   Title  independent with HEP and understand how to progress herself    Time  6    Period  Weeks    Status  On-going      PT LONG TERM GOAL #2   Title  cervical pain in sitting and standing decreased >/= 60%     Time  6    Period  Weeks    Status  Achieved      PT LONG TERM GOAL #3   Title  cervical ROM improved so she is able to turn her head to look behind due to increased mobility    Time  6    Period  Weeks    Status  On-going      PT LONG TERM GOAL #4   Title  ability to type on the computer without difficulty due to decreased numbness in the left fingers    Time  6    Period  Weeks    Status  On-going            Plan - 02/13/18 1610    Clinical  Impression Statement  Patient is having intermittent tingling in left upper extremity. Patient is not having cervical and interscapular pain.  Patient has improved neural tension stretch in left upper extremity. Patient has full strength of left upper extremity. Patient has improved left shoulder flexion. Patient has less tightness in the left cervical and left shoulder tissue. Patient will benefit from skilled therapy to decrease tingling in left arm while performing work tasks.      Rehab Potential  Excellent    PT Frequency  2x / week    PT Duration  6 weeks    PT Treatment/Interventions  Cryotherapy;Electrical Stimulation;Moist Heat;Traction;Therapeutic exercise;Therapeutic activities;Neuromuscular re-education;Patient/family education;Manual techniques;Passive range of motion;Dry needling    PT Next Visit Plan   see what MD says; cervical traction; measure cervical ROM; mobilization to upper thoracic; neural tension stretch to left upper extremity; posture;     PT Home Exercise Plan  Access Code: [email or web address]    Recommended Other Services  MD signed initial eval    Consulted and Agree with Plan of Care  Patient       Patient will benefit from skilled therapeutic intervention in order to improve the following deficits and impairments:  Pain, Increased fascial restricitons, Decreased mobility, Increased muscle spasms, Decreased activity tolerance, Decreased range of motion, Decreased strength  Visit Diagnosis: Cervicalgia  Muscle weakness (generalized)  Cramp and spasm     Problem List Patient Active Problem List   Diagnosis Date Noted  . Urticaria 08/14/2017  . Seasonal and perennial allergic rhinitis 08/14/2017  . Allergic reaction 08/14/2017  . Type II or unspecified type diabetes mellitus  without mention of complication, uncontrolled 03/09/2013  . Bunion 03/09/2013  . Plantar fasciitis, bilateral 03/09/2013  . Unspecified vitamin D deficiency 08/31/2012  . Type II or unspecified  type diabetes mellitus without mention of complication, not stated as uncontrolled 08/31/2012  . HYPERLIPIDEMIA 08/05/2010  . ANGIOEDEMA 08/05/2010  . Discoid lupus 06/12/2010  . Obesity 06/12/2010  . Diabetes mellitus 10/31/2009    Earlie Counts, PT 02/13/18 8:42 AM   [facility] [address] [place], [place], [address] Phone: [phone]   Fax:  [phone]  Name: BREEANA SAWTELLE MRN: [id number] Date of Birth: 1955-09-01

## 2018-02-17 MED FILL — TRULICITY 1.5 MG/0.5 ML PEN: 1.5 | 28 days supply | Qty: 2 | Fill #1

## 2018-02-17 MED FILL — SYNJARDY XR 12.5-1,000 MG T: 12.5-1000 | 30 days supply | Qty: 60 | Fill #1

## 2018-02-18 DIAGNOSIS — Z9889 Other specified postprocedural states: Secondary | ICD-10-CM | POA: Diagnosis not present

## 2018-02-18 DIAGNOSIS — M5030 Other cervical disc degeneration, unspecified cervical region: Secondary | ICD-10-CM | POA: Diagnosis not present

## 2018-02-18 DIAGNOSIS — M542 Cervicalgia: Secondary | ICD-10-CM | POA: Diagnosis not present

## 2018-02-18 DIAGNOSIS — G5602 Carpal tunnel syndrome, left upper limb: Secondary | ICD-10-CM | POA: Diagnosis not present

## 2018-02-18 DIAGNOSIS — M4722 Other spondylosis with radiculopathy, cervical region: Secondary | ICD-10-CM | POA: Diagnosis not present

## 2018-02-18 DIAGNOSIS — M5020 Other cervical disc displacement, unspecified cervical region: Secondary | ICD-10-CM | POA: Diagnosis not present

## 2018-02-18 DIAGNOSIS — M5412 Radiculopathy, cervical region: Secondary | ICD-10-CM | POA: Diagnosis not present

## 2018-02-25 ENCOUNTER — Encounter

## 2018-03-04 ENCOUNTER — Ambulatory Visit: Payer: 59 | Attending: Neurosurgery | Admitting: Physical Therapy

## 2018-03-04 ENCOUNTER — Encounter: Payer: Self-pay | Admitting: Physical Therapy

## 2018-03-04 DIAGNOSIS — M542 Cervicalgia: Secondary | ICD-10-CM | POA: Diagnosis not present

## 2018-03-04 DIAGNOSIS — R252 Cramp and spasm: Secondary | ICD-10-CM | POA: Diagnosis not present

## 2018-03-04 DIAGNOSIS — M6281 Muscle weakness (generalized): Secondary | ICD-10-CM | POA: Insufficient documentation

## 2018-03-04 NOTE — Therapy (Signed)
[facility] [address], [place], [place], [address] Phone: [phone]   Fax:  [phone]  Physical Therapy Treatment  Patient Details  Name: Theresa Meyers MRN: [id number] Date of Birth: October 23, 1955 Referring Provider (PT): Dr, Jovita Gamma   Encounter Date: 03/04/2018  PT End of Session - 03/04/18 0820    Visit Number  9    Date for PT Re-Evaluation  03/04/18    Authorization Type  UMR    PT Start Time  0807    PT Stop Time  0900    PT Time Calculation (min)  53 min    Activity Tolerance  Patient tolerated treatment well;No increased pain    Behavior During Therapy  WFL for tasks assessed/performed       Past Medical History:  Diagnosis Date  . Anemia   . Colon polyp   . Diabetes mellitus   . Discoid lupus    Dr. Abner Greenspan  . Hypercholesteremia   . Vitamin D deficiency     Past Surgical History:  Procedure Laterality Date  . CARPAL TUNNEL RELEASE    . FOOT SURGERY      There were no vitals filed for this visit.  Subjective Assessment - 03/04/18 0810    Subjective  I have seen both doctors. I saw Dr. Sherwood Gambler and he wants me to have surgery. I see the him again in 3 months. Dr. Maggie Font saie it will take awhile for my nerve to heal. I only feel the tinglin  in the lower right arm. I do not feel my neck alot. My neck pain is 85% better. It is not constant pain .    Patient Stated Goals  avoid surgery,     Currently in Pain?  Yes    Pain Score  5     Pain Location  Neck    Pain Orientation  Left    Pain Descriptors / Indicators  Dull    Pain Type  Acute pain    Pain Onset  More than a month ago    Pain Frequency  Intermittent    Aggravating Factors   sitting at work, turning to the right    Pain Relieving Factors  change positions    Multiple Pain Sites  No         [facility] PT Assessment - 03/04/18 0001      Assessment   Medical Diagnosis  M54.12 Cervical radiculopathy    Referring Provider (PT)   Dr, Jovita Gamma    Onset Date/Surgical Date  12/22/17    Hand Dominance  Right    Prior Therapy  none      Precautions   Precautions  None      Restrictions   Weight Bearing Restrictions  No      Home Environment   Living Environment  Private residence      Prior Function   Level of Independence  Independent    Vocation  Full time employment    Vocation Requirements  sitting, answer phones, turning head      Cognition   Overall Cognitive Status  Within Functional Limits for tasks assessed      Observation/Other Assessments   Focus on Therapeutic Outcomes (FOTO)   31% limitation      AROM   Cervical Flexion  50    Cervical Extension  45    Cervical - Right Side Bend  30    Cervical - Left Side Bend  35  Cervical - Right Rotation  60    Cervical - Left Rotation  55                   [facility] Adult PT Treatment/Exercise - 03/04/18 0001      Neck Exercises: Supine   Other Supine Exercise  supine on foam roll with alternating shoulder flexion; Y motion of arms, scapula retraction and protraction; horizontal shoulder abduction      Modalities   Modalities  Traction      Traction   Type of Traction  Cervical    Min (lbs)  5    Max (lbs)  18    Hold Time  45    Rest Time  15    Time  12             PT Education - 03/04/18 0848    Education Details  information on left wrist stretches and strengthening    Person(s) Educated  Patient    Methods  Explanation;Demonstration;Verbal cues;Handout    Comprehension  Verbalized understanding;Returned demonstration       PT Short Term Goals - 02/06/18 0838      PT SHORT TERM GOAL #2   Title  cervical pain decreased >/= 25%     Time  3    Period  Weeks    Status  Achieved        PT Long Term Goals - 03/04/18 0830      PT LONG TERM GOAL #1   Title  independent with HEP and understand how to progress herself    Time  6    Period  Weeks    Status  Achieved      PT LONG TERM GOAL #2   Title   cervical pain in sitting and standing decreased >/= 60%     Time  6    Period  Weeks    Status  Achieved      PT LONG TERM GOAL #3   Title  cervical ROM improved so she is able to turn her head to look behind due to increased mobility    Time  6    Period  Weeks    Status  Achieved      PT LONG TERM GOAL #4   Title  ability to type on the computer without difficulty due to decreased numbness in the left fingers    Baseline  had surgery on left wrist for carpal tunnel    Period  Weeks    Status  Not Met            Plan - 03/04/18 0849    Clinical Impression Statement  Patient has met all of the goals except for the numbness in the left hand due to having carpal tunnel surgery. Patient has increased cervical ROM. Patient reports her neck and shoulder pain has improved by 85%. Patient is having difficulty with  using her left hand after her carpal tunnel surgery by a different doctor. Patient will have 5/10 pain when she is first sitting at work and turning her head to the right. Patient is ready for discharge.     Rehab Potential  Excellent    PT Treatment/Interventions  Cryotherapy;Electrical Stimulation;Moist Heat;Traction;Therapeutic exercise;Therapeutic activities;Neuromuscular re-education;Patient/family education;Manual techniques;Passive range of motion;Dry needling    PT Next Visit Plan  discharge to HEP this visit    PT Home Exercise Plan  Access Code: [email or web address]    Consulted and Agree with Plan of Care  Patient       Patient will benefit from skilled therapeutic intervention in order to improve the following deficits and impairments:  Pain, Increased fascial restricitons, Decreased mobility, Increased muscle spasms, Decreased activity tolerance, Decreased range of motion, Decreased strength  Visit Diagnosis: Cervicalgia  Muscle weakness (generalized)  Cramp and spasm     Problem List Patient Active Problem List   Diagnosis Date Noted  . Urticaria 08/14/2017  .  Seasonal and perennial allergic rhinitis 08/14/2017  . Allergic reaction 08/14/2017  . Type II or unspecified type diabetes mellitus without mention of complication, uncontrolled 03/09/2013  . Bunion 03/09/2013  . Plantar fasciitis, bilateral 03/09/2013  . Unspecified vitamin D deficiency 08/31/2012  . Type II or unspecified type diabetes mellitus without mention of complication, not stated as uncontrolled 08/31/2012  . HYPERLIPIDEMIA 08/05/2010  . ANGIOEDEMA 08/05/2010  . Discoid lupus 06/12/2010  . Obesity 06/12/2010  . Diabetes mellitus 10/31/2009    Earlie Counts, PT 03/04/18 8:54 AM   [facility] [address], [place], [place], [address] Phone: [phone]   Fax:  [phone]  Name: Theresa Meyers MRN: [id number] Date of Birth: [birth year] PHYSICAL THERAPY DISCHARGE SUMMARY  Visits from Start of Care: 9  Current functional level related to goals / functional outcomes: See above.    Remaining deficits: See above.    Education / Equipment: HEP Plan: Patient agrees to discharge.  Patient goals were partially met. Patient is being discharged due to meeting the stated rehab goals.  Thank you for the referral. Earlie Counts, PT 03/04/18 8:54 AM  ?????

## 2018-03-04 NOTE — Patient Instructions (Addendum)
Cervical Towel Traction you tube video, I looked for cervical traction with towel   Wrist Flexor Stretch    Keeping elbow straight, grasp left hand and slowly bend wrist back until stretch is felt. Hold _15___ seconds. Relax. Repeat __5__ times per set. Do __1__ sets per session. Do __1__ sessions per day.  Copyright  VHI. All rights reserved.    Towel Roll Squeeze    With right forearm resting on surface, gently squeeze towel. Repeat ___5_ times per set. Do __1__ sets per session. Do _1___ sessions per day.  Copyright  VHI. All rights reserved.  Thumb Opposition: Resisted    With rubber band around right thumb, hold other end with other hand. Rotate thumb up and over toward little finger. Repeat toward each finger. Repeat ____ times per set. Do ____ sets per session. Do ____ sessions per day.  Copyright  VHI. All rights reserved.   Paper Crumpling Exercise    Begin with right palm down on piece of paper. Maintaining contact between surface and heel of hand, crumple paper into a ball. Repeat ____ times per set. Do ____ sets per session. Do ____ sessions per day.  Copyright  VHI. All rights reserved.  [facility] [address] [place], [place] [address] Phone # [phone] Fax [phone] [email or web address]

## 2018-03-17 MED FILL — TRULICITY 1.5 MG/0.5 ML PEN: 1.5 | 28 days supply | Qty: 2 | Fill #2

## 2018-03-17 MED FILL — MELOXICAM 15 MG TABLET: 15 | 30 days supply | Qty: 30 | Fill #1

## 2018-03-17 MED FILL — MYRBETRIQ ER 25 MG TABLET: 25 | 30 days supply | Qty: 30 | Fill #1

## 2018-04-16 MED FILL — MYRBETRIQ ER 25 MG TABLET: 25 | 30 days supply | Qty: 30 | Fill #2

## 2018-04-16 MED FILL — SYNJARDY XR 12.5-1,000 MG T: 12.5-1000 | 30 days supply | Qty: 60 | Fill #2

## 2018-04-16 MED FILL — ATORVASTATIN 40 MG TABLET: 40 | 90 days supply | Qty: 90 | Fill #1

## 2018-04-16 MED FILL — TRULICITY 1.5 MG/0.5 ML PEN: 1.5 | 28 days supply | Qty: 2 | Fill #3

## 2018-04-19 ENCOUNTER — Ambulatory Visit (HOSPITAL_BASED_OUTPATIENT_CLINIC_OR_DEPARTMENT_OTHER)
Admission: RE | Admit: 2018-04-19 | Discharge: 2018-04-19 | Disposition: A | Discharge status: Home / Self Care | Payer: 59 | Source: Ambulatory Visit | Attending: Family Medicine | Admitting: Family Medicine

## 2018-04-19 ENCOUNTER — Encounter (HOSPITAL_BASED_OUTPATIENT_CLINIC_OR_DEPARTMENT_OTHER): Payer: Self-pay

## 2018-04-19 DIAGNOSIS — Z1231 Encounter for screening mammogram for malignant neoplasm of breast: Secondary | ICD-10-CM | POA: Diagnosis present

## 2018-05-16 MED FILL — TRULICITY 1.5 MG/0.5 ML PEN: 1.5 | 28 days supply | Qty: 2 | Fill #4

## 2018-05-16 MED FILL — MYRBETRIQ ER 25 MG TABLET: 25 | 30 days supply | Qty: 30 | Fill #3

## 2018-06-13 DIAGNOSIS — R5383 Other fatigue: Secondary | ICD-10-CM | POA: Diagnosis not present

## 2018-06-13 DIAGNOSIS — E785 Hyperlipidemia, unspecified: Secondary | ICD-10-CM | POA: Diagnosis not present

## 2018-06-13 DIAGNOSIS — E119 Type 2 diabetes mellitus without complications: Secondary | ICD-10-CM | POA: Diagnosis not present

## 2018-06-16 MED FILL — TRULICITY 1.5 MG/0.5 ML PEN: 1.5 | 28 days supply | Qty: 2 | Fill #5

## 2018-06-16 MED FILL — MYRBETRIQ ER 25 MG TABLET: 25 | 30 days supply | Qty: 30 | Fill #4

## 2018-06-20 DIAGNOSIS — Z23 Encounter for immunization: Secondary | ICD-10-CM | POA: Diagnosis not present

## 2018-06-20 DIAGNOSIS — E119 Type 2 diabetes mellitus without complications: Secondary | ICD-10-CM | POA: Diagnosis not present

## 2018-06-20 DIAGNOSIS — E785 Hyperlipidemia, unspecified: Secondary | ICD-10-CM | POA: Diagnosis not present

## 2018-06-20 MED FILL — FREESTYLE LITE TEST STRIP: 50 days supply | Qty: 50 | Fill #0

## 2018-06-20 MED FILL — SYNJARDY XR 12.5-1,000 MG T: 12.5-1000 | 30 days supply | Qty: 60 | Fill #0

## 2018-06-20 MED FILL — FREESTYLE LANCETS: 90 days supply | Qty: 100 | Fill #0

## 2018-07-15 MED FILL — MYRBETRIQ ER 25 MG TABLET: 25 | 30 days supply | Qty: 30 | Fill #5

## 2018-07-15 MED FILL — TRULICITY 1.5 MG/0.5 ML PEN: 1.5 | 28 days supply | Qty: 2 | Fill #0

## 2018-07-15 MED FILL — ATORVASTATIN 40 MG TABLET: 40 | 90 days supply | Qty: 90 | Fill #2

## 2018-08-12 MED FILL — TRULICITY 1.5 MG/0.5 ML PEN: 1.5 | 28 days supply | Qty: 2 | Fill #1

## 2018-08-28 MED FILL — MYRBETRIQ ER 25 MG TABLET: 25 | 30 days supply | Qty: 30 | Fill #6

## 2018-09-10 MED FILL — SYNJARDY XR 12.5-1,000 MG T: 12.5-1000 | 30 days supply | Qty: 60 | Fill #1

## 2018-09-10 MED FILL — TRULICITY 1.5 MG/0.5 ML PEN: 1.5 | 28 days supply | Qty: 2 | Fill #2

## 2018-09-30 MED FILL — MYRBETRIQ ER 25 MG TABLET: 25 | 30 days supply | Qty: 30 | Fill #7

## 2018-10-02 DIAGNOSIS — Z01419 Encounter for gynecological examination (general) (routine) without abnormal findings: Secondary | ICD-10-CM | POA: Diagnosis not present

## 2018-10-02 DIAGNOSIS — B373 Candidiasis of vulva and vagina: Secondary | ICD-10-CM | POA: Diagnosis not present

## 2018-10-06 MED FILL — TERCONAZOLE 0.4% VAG CREAM: 0.4 | 7 days supply | Qty: 45 | Fill #0

## 2018-10-07 MED FILL — TRULICITY 1.5 MG/0.5 ML PEN: 1.5 | 28 days supply | Qty: 2 | Fill #3

## 2018-10-27 MED FILL — ATORVASTATIN 40 MG TABLET: 40 | 90 days supply | Qty: 90 | Fill #3

## 2018-10-27 MED FILL — SYNJARDY XR 12.5-1,000 MG T: 12.5-1000 | 30 days supply | Qty: 60 | Fill #2

## 2018-10-27 MED FILL — MYRBETRIQ ER 25 MG TABLET: 25 | 30 days supply | Qty: 30 | Fill #8

## 2018-10-29 MED FILL — TRULICITY 1.5 MG/0.5 ML PEN: 1.5 | 28 days supply | Qty: 2 | Fill #4

## 2018-11-28 MED FILL — SYNJARDY XR 12.5-1,000 MG T: 12.5-1000 | 30 days supply | Qty: 60 | Fill #3

## 2018-11-28 MED FILL — TRULICITY 1.5 MG/0.5 ML PEN: 1.5 | 28 days supply | Qty: 2 | Fill #5

## 2018-11-28 MED FILL — MYRBETRIQ ER 25 MG TABLET: 25 | 30 days supply | Qty: 90 | Fill #0

## 2018-12-26 DIAGNOSIS — E119 Type 2 diabetes mellitus without complications: Secondary | ICD-10-CM | POA: Diagnosis not present

## 2019-01-01 DIAGNOSIS — R5383 Other fatigue: Secondary | ICD-10-CM | POA: Diagnosis not present

## 2019-01-01 DIAGNOSIS — E785 Hyperlipidemia, unspecified: Secondary | ICD-10-CM | POA: Diagnosis not present

## 2019-01-01 DIAGNOSIS — E119 Type 2 diabetes mellitus without complications: Secondary | ICD-10-CM | POA: Diagnosis not present

## 2019-01-01 DIAGNOSIS — L989 Disorder of the skin and subcutaneous tissue, unspecified: Secondary | ICD-10-CM | POA: Diagnosis not present

## 2019-01-01 MED FILL — SYNJARDY XR 12.5-1,000 MG T: 12.5-1000 | 30 days supply | Qty: 60 | Fill #0

## 2019-01-01 MED FILL — MUPIROCIN 2% OINTMENT: 2 | 10 days supply | Qty: 22 | Fill #0

## 2019-01-01 MED FILL — TRULICITY 1.5 MG/0.5 ML PEN: 1.5 | 28 days supply | Qty: 2 | Fill #0

## 2019-01-15 DIAGNOSIS — N3090 Cystitis, unspecified without hematuria: Secondary | ICD-10-CM | POA: Diagnosis not present

## 2019-01-15 DIAGNOSIS — B373 Candidiasis of vulva and vagina: Secondary | ICD-10-CM | POA: Diagnosis not present

## 2019-01-15 DIAGNOSIS — R300 Dysuria: Secondary | ICD-10-CM | POA: Diagnosis not present

## 2019-01-15 MED FILL — NITROFURANTOIN MONO-MCR 100: 100 | 5 days supply | Qty: 10 | Fill #0

## 2019-01-15 MED FILL — PHENAZOPYRIDINE 200 MG TAB: 200 | 3 days supply | Qty: 9 | Fill #0

## 2019-01-26 MED FILL — TRULICITY 1.5 MG/0.5 ML PEN: 1.5 | 28 days supply | Qty: 2 | Fill #1

## 2019-01-26 MED FILL — ATORVASTATIN 40 MG TABLET: 40 | 90 days supply | Qty: 90 | Fill #0

## 2019-01-26 MED FILL — MUPIROCIN 2% OINTMENT: 2 | 10 days supply | Qty: 22 | Fill #1

## 2019-03-05 MED FILL — FREESTYLE LITE TEST STRIP: 50 days supply | Qty: 50 | Fill #1

## 2019-03-05 MED FILL — MYRBETRIQ ER 25 MG TABLET: 25 | 90 days supply | Qty: 90 | Fill #1

## 2019-03-31 DIAGNOSIS — H52203 Unspecified astigmatism, bilateral: Secondary | ICD-10-CM | POA: Diagnosis not present

## 2019-03-31 DIAGNOSIS — H5213 Myopia, bilateral: Secondary | ICD-10-CM | POA: Diagnosis not present

## 2019-03-31 DIAGNOSIS — H524 Presbyopia: Secondary | ICD-10-CM | POA: Diagnosis not present

## 2019-04-14 DIAGNOSIS — Z03818 Encounter for observation for suspected exposure to other biological agents ruled out: Secondary | ICD-10-CM | POA: Diagnosis not present

## 2019-05-01 DIAGNOSIS — Z20822 Contact with and (suspected) exposure to covid-19: Secondary | ICD-10-CM | POA: Diagnosis not present

## 2019-05-01 MED FILL — ATORVASTATIN 40 MG TABLET: 40 | 90 days supply | Qty: 90 | Fill #1

## 2019-05-04 MED FILL — SYNJARDY XR 12.5-1,000 MG T: 12.5-1000 | 30 days supply | Qty: 60 | Fill #1

## 2019-05-04 MED FILL — TRULICITY 1.5 MG/0.5 ML PEN: 1.5 | 28 days supply | Qty: 2 | Fill #3

## 2019-05-04 MED FILL — MUPIROCIN 2% OINTMENT: 2 | 10 days supply | Qty: 22 | Fill #2

## 2019-05-05 DIAGNOSIS — H2511 Age-related nuclear cataract, right eye: Secondary | ICD-10-CM | POA: Diagnosis not present

## 2019-05-05 DIAGNOSIS — H2513 Age-related nuclear cataract, bilateral: Secondary | ICD-10-CM | POA: Diagnosis not present

## 2019-05-05 DIAGNOSIS — H18413 Arcus senilis, bilateral: Secondary | ICD-10-CM | POA: Diagnosis not present

## 2019-05-05 DIAGNOSIS — H25043 Posterior subcapsular polar age-related cataract, bilateral: Secondary | ICD-10-CM | POA: Diagnosis not present

## 2019-05-05 DIAGNOSIS — H25013 Cortical age-related cataract, bilateral: Secondary | ICD-10-CM | POA: Diagnosis not present

## 2019-05-12 NOTE — Telephone Encounter (Signed)
Error

## 2019-06-16 MED FILL — MYRBETRIQ ER 25 MG TABLET: 25 | 90 days supply | Qty: 90 | Fill #2

## 2019-07-02 DIAGNOSIS — E119 Type 2 diabetes mellitus without complications: Secondary | ICD-10-CM | POA: Diagnosis not present

## 2019-07-02 DIAGNOSIS — E785 Hyperlipidemia, unspecified: Secondary | ICD-10-CM | POA: Diagnosis not present

## 2019-07-02 DIAGNOSIS — R5383 Other fatigue: Secondary | ICD-10-CM | POA: Diagnosis not present

## 2019-07-02 MED FILL — TRULICITY 1.5 MG/0.5 ML PEN: 1.5 | 28 days supply | Qty: 2 | Fill #4

## 2019-07-02 MED FILL — SYNJARDY XR 12.5-1,000 MG T: 12.5-1000 | 30 days supply | Qty: 60 | Fill #2

## 2019-07-15 DIAGNOSIS — Z91013 Allergy to seafood: Secondary | ICD-10-CM | POA: Diagnosis not present

## 2019-07-15 DIAGNOSIS — E1165 Type 2 diabetes mellitus with hyperglycemia: Secondary | ICD-10-CM | POA: Diagnosis not present

## 2019-07-15 DIAGNOSIS — E785 Hyperlipidemia, unspecified: Secondary | ICD-10-CM | POA: Diagnosis not present

## 2019-07-15 MED FILL — EPINEPHRINE 0.3 MG AUTO-INJ: 0.3 | 2 days supply | Qty: 2 | Fill #0

## 2019-07-24 MED FILL — ATORVASTATIN CALCIUM 40 MG: 40 | 90 days supply | Qty: 90 | Fill #2

## 2019-07-24 MED FILL — OMRON 3 SERIES BP MONITOR D: 1 days supply | Qty: 1 | Fill #0

## 2019-07-24 MED FILL — TRULICITY 1.5 MG/0.5 ML PEN: 1.5 | 30 days supply | Qty: 2 | Fill #0

## 2019-08-21 MED FILL — TRULICITY 1.5 MG/0.5 ML PEN: 1.5 | 30 days supply | Qty: 2 | Fill #1

## 2019-09-14 MED FILL — MYRBETRIQ ER 25 MG TABLET: 25 | 90 days supply | Qty: 90 | Fill #0

## 2019-10-30 MED FILL — ATORVASTATIN CALCIUM 40 MG: 40 | 90 days supply | Qty: 90 | Fill #3

## 2019-10-30 MED FILL — TRULICITY 1.5 MG/0.5 ML PEN: 1.5 | 30 days supply | Qty: 2 | Fill #1

## 2019-10-30 MED FILL — SYNJARDY XR 12.5-1,000 MG T: 12.5-1000 | 30 days supply | Qty: 60 | Fill #4

## 2019-11-23 DIAGNOSIS — Z124 Encounter for screening for malignant neoplasm of cervix: Secondary | ICD-10-CM | POA: Diagnosis not present

## 2019-11-23 DIAGNOSIS — Z9851 Tubal ligation status: Secondary | ICD-10-CM | POA: Diagnosis not present

## 2019-11-23 DIAGNOSIS — Z01419 Encounter for gynecological examination (general) (routine) without abnormal findings: Secondary | ICD-10-CM | POA: Diagnosis not present

## 2019-11-23 DIAGNOSIS — Z1151 Encounter for screening for human papillomavirus (HPV): Secondary | ICD-10-CM | POA: Diagnosis not present

## 2019-11-23 MED FILL — VENLAFAXINE HCL ER 75 MG CA: 75 | 90 days supply | Qty: 90 | Fill #0

## 2019-12-02 DIAGNOSIS — L218 Other seborrheic dermatitis: Secondary | ICD-10-CM | POA: Diagnosis not present

## 2019-12-02 DIAGNOSIS — L720 Epidermal cyst: Secondary | ICD-10-CM | POA: Diagnosis not present

## 2019-12-17 MED FILL — TRULICITY 1.5 MG/0.5 ML PEN: 1.5 | 30 days supply | Qty: 2 | Fill #2

## 2019-12-17 MED FILL — MYRBETRIQ ER 25 MG TABLET: 25 | 90 days supply | Qty: 90 | Fill #1

## 2020-01-01 ENCOUNTER — Other Ambulatory Visit (HOSPITAL_BASED_OUTPATIENT_CLINIC_OR_DEPARTMENT_OTHER): Payer: Self-pay | Admitting: Family Medicine

## 2020-01-01 DIAGNOSIS — Z1231 Encounter for screening mammogram for malignant neoplasm of breast: Secondary | ICD-10-CM

## 2020-01-07 DIAGNOSIS — E1165 Type 2 diabetes mellitus with hyperglycemia: Secondary | ICD-10-CM | POA: Diagnosis not present

## 2020-01-12 IMAGING — MG DIGITAL SCREENING BILATERAL MAMMOGRAM WITH TOMO AND CAD
8 series · 8 of 24 positions shown · non-contrast
Comparison: Previous exam(s).

CLINICAL DATA: Screening.

EXAM:
DIGITAL SCREENING BILATERAL MAMMOGRAM WITH TOMO AND CAD

[L MLO synth-2D]
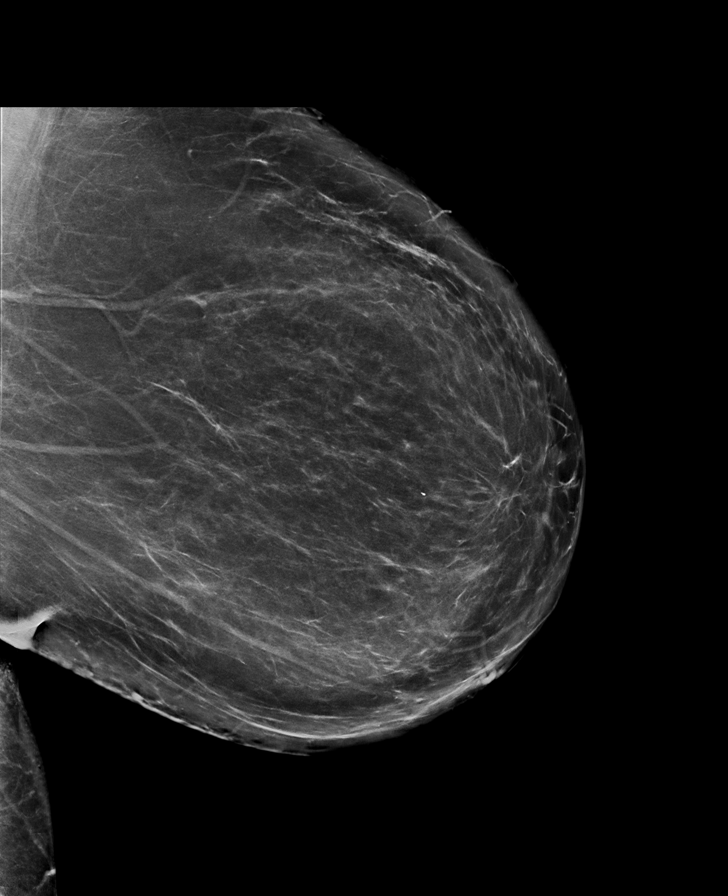

[R MLO synth-2D]
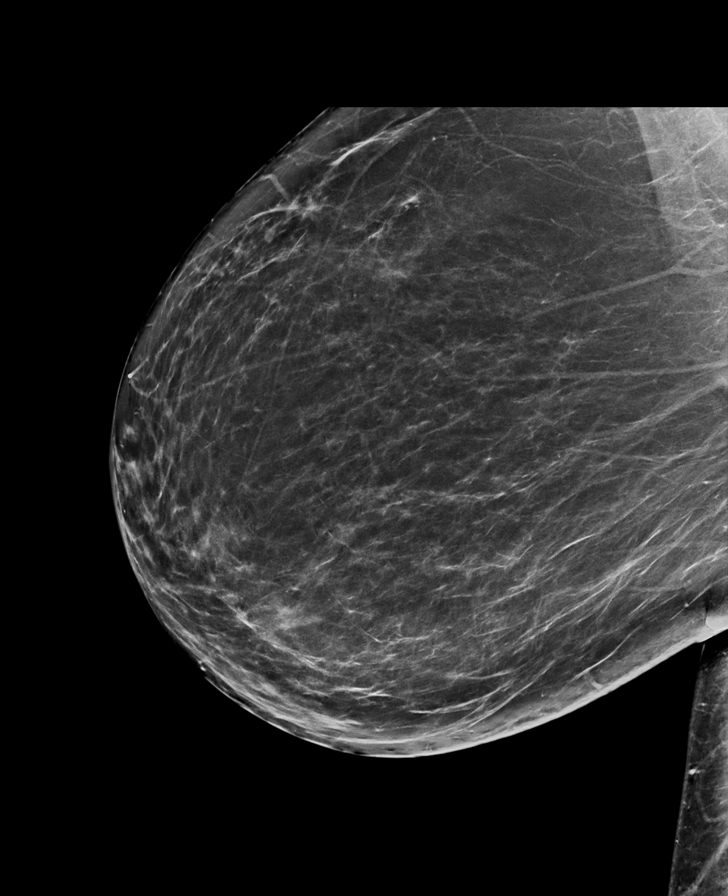

[R CC synth-2D]
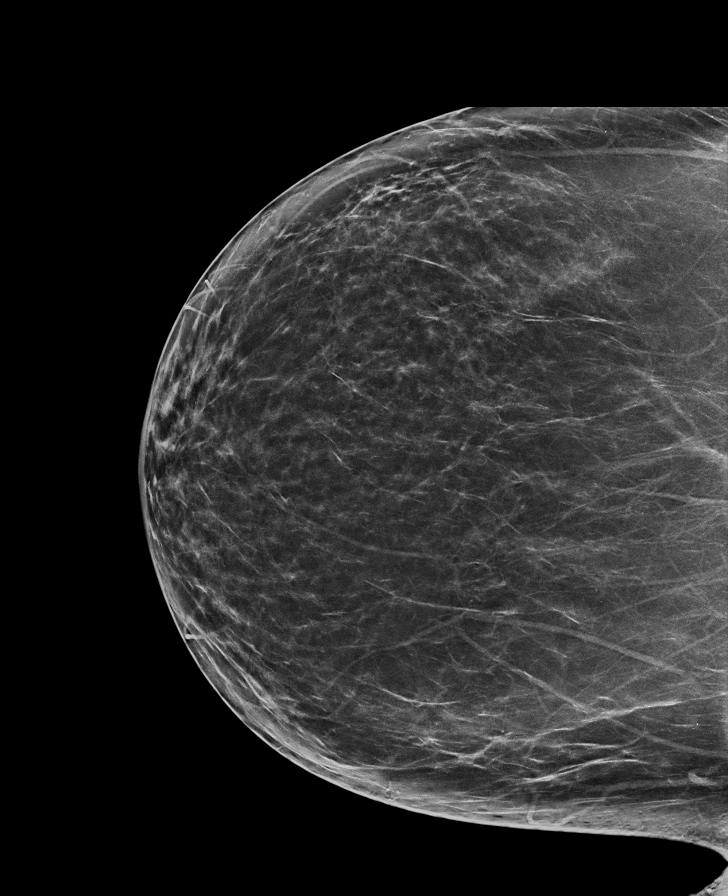

[L CC synth-2D]
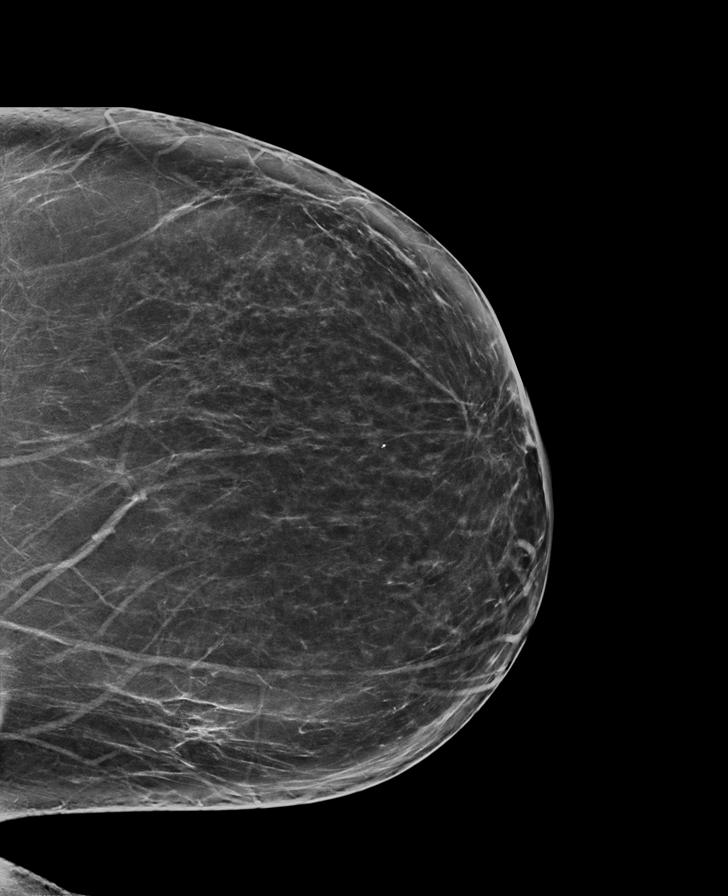

[R MLO tomo · tomo slice 45/90.0]
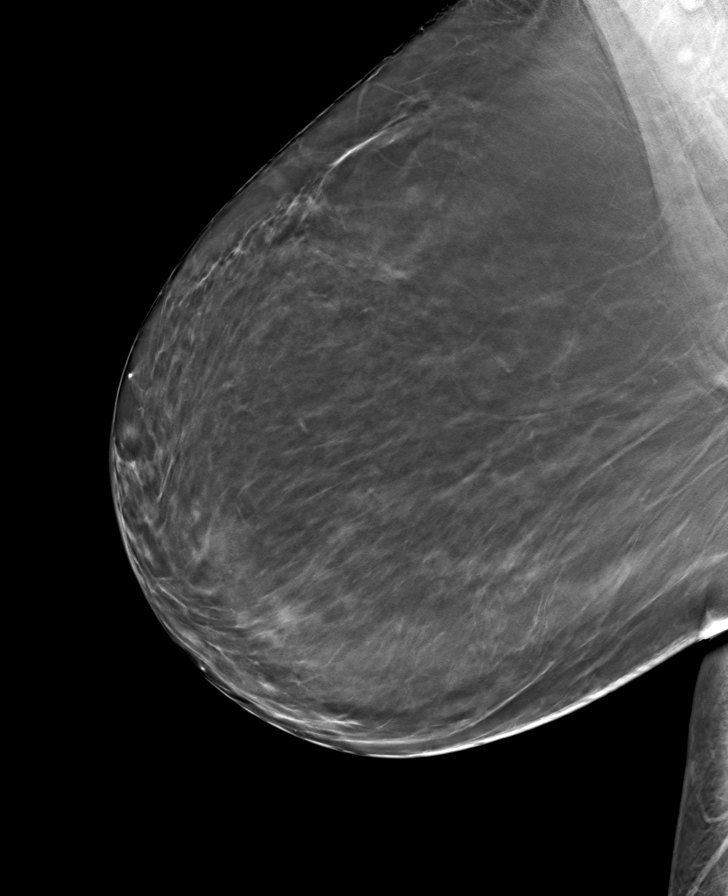

[R CC tomo · tomo slice 43/84.0]
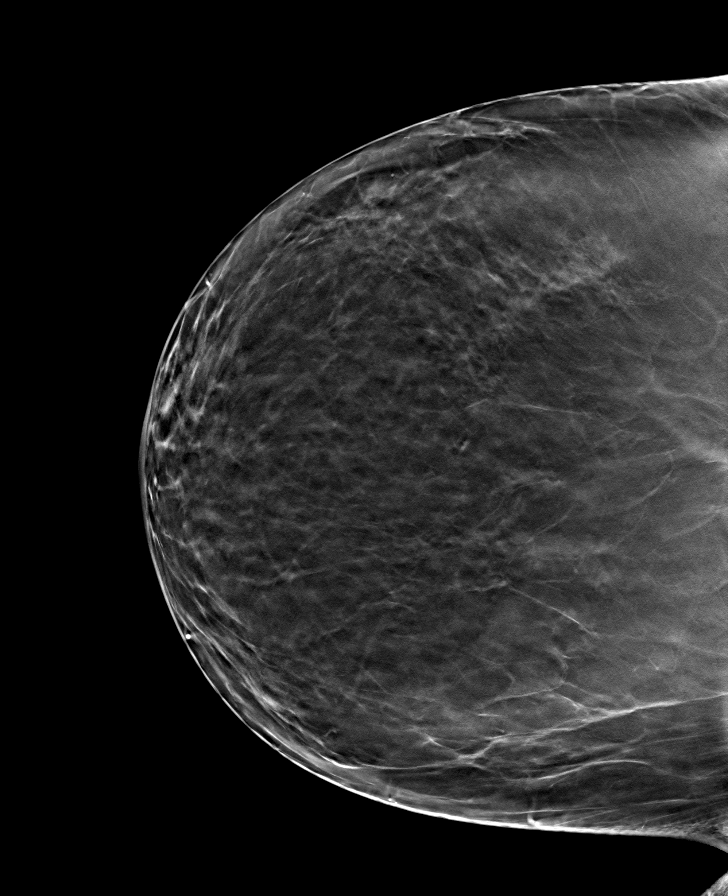

[L MLO tomo · tomo slice 47/93.0]
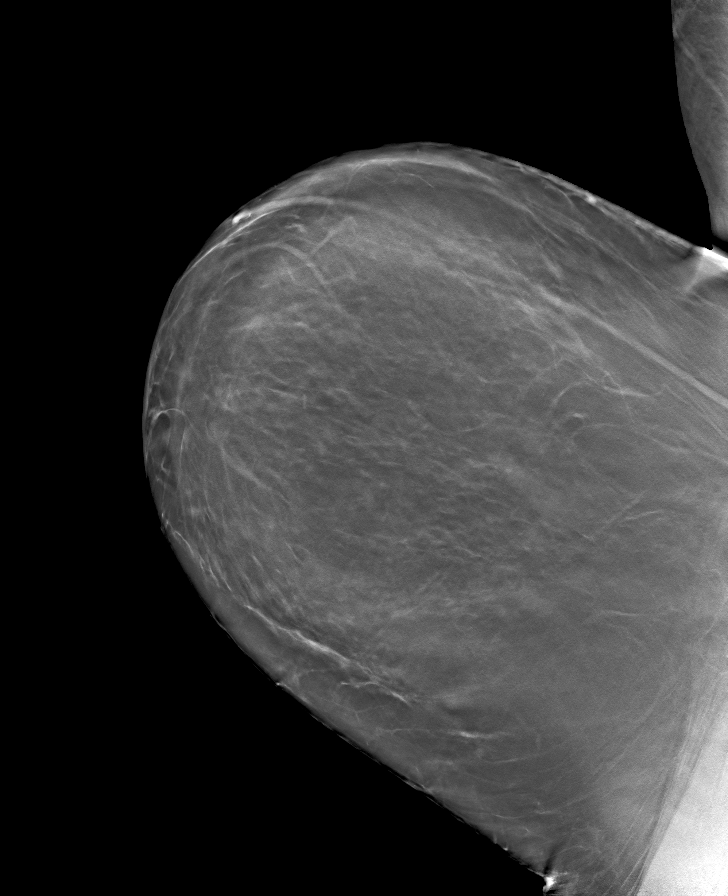

[L CC tomo · tomo slice 43/85.0]
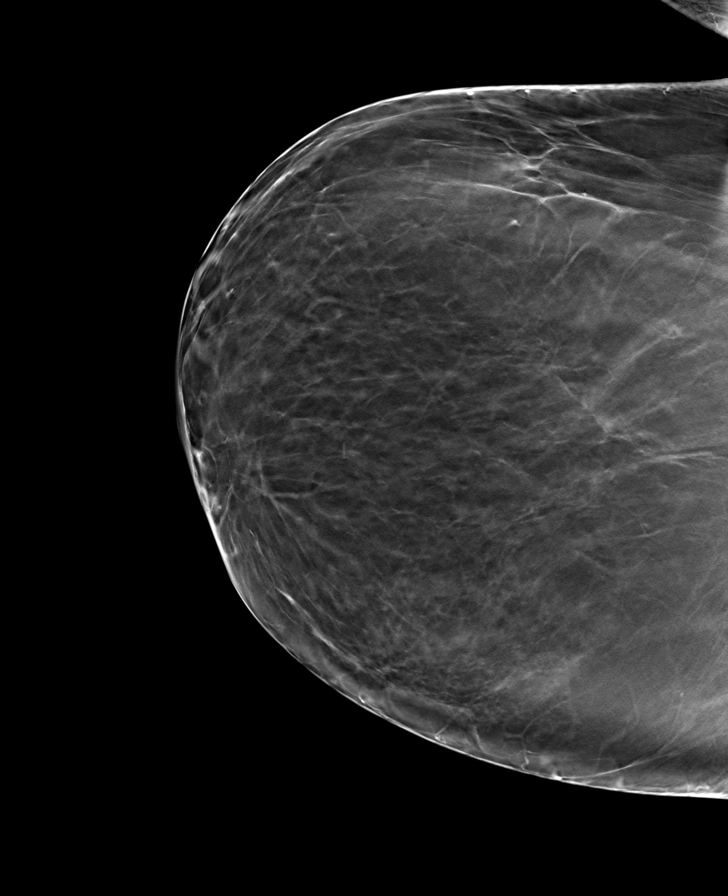

[8 of 24 positions shown; findings below may reference images not displayed]

ACR Breast Density Category b: There are scattered areas of
fibroglandular density.
FINDINGS: There are no findings suspicious for malignancy. Images were
processed with CAD.
IMPRESSION: No mammographic evidence of malignancy. A result letter of this
screening mammogram will be mailed directly to the patient.

RECOMMENDATION:
Screening mammogram in one year. (Code:[id number])

BI-RADS CATEGORY  1: Negative.

## 2020-01-14 ENCOUNTER — Other Ambulatory Visit (HOSPITAL_BASED_OUTPATIENT_CLINIC_OR_DEPARTMENT_OTHER): Payer: Self-pay | Admitting: Family Medicine

## 2020-01-14 DIAGNOSIS — E785 Hyperlipidemia, unspecified: Secondary | ICD-10-CM | POA: Diagnosis not present

## 2020-01-14 DIAGNOSIS — I83813 Varicose veins of bilateral lower extremities with pain: Secondary | ICD-10-CM | POA: Diagnosis not present

## 2020-01-14 DIAGNOSIS — E1165 Type 2 diabetes mellitus with hyperglycemia: Secondary | ICD-10-CM | POA: Diagnosis not present

## 2020-01-14 MED FILL — ATORVASTATIN CALCIUM 40 MG: 40 | 90 days supply | Qty: 90 | Fill #0

## 2020-01-14 MED FILL — SYNJARDY XR 12.5-1,000 MG T: 12.5-1000 | 30 days supply | Qty: 60 | Fill #0

## 2020-01-14 MED FILL — TRULICITY 1.5 MG/0.5 ML PEN: 1.5 | 28 days supply | Qty: 2 | Fill #0

## 2020-01-25 ENCOUNTER — Ambulatory Visit (HOSPITAL_BASED_OUTPATIENT_CLINIC_OR_DEPARTMENT_OTHER)
Admission: RE | Admit: 2020-01-25 | Discharge: 2020-01-25 | Disposition: A | Discharge status: Home / Self Care | Payer: 59 | Source: Ambulatory Visit | Attending: Family Medicine | Admitting: Family Medicine

## 2020-01-25 ENCOUNTER — Other Ambulatory Visit: Payer: Self-pay

## 2020-01-25 ENCOUNTER — Encounter (HOSPITAL_BASED_OUTPATIENT_CLINIC_OR_DEPARTMENT_OTHER): Payer: Self-pay

## 2020-01-25 DIAGNOSIS — Z1231 Encounter for screening mammogram for malignant neoplasm of breast: Secondary | ICD-10-CM | POA: Diagnosis not present

## 2020-02-18 MED FILL — TRULICITY 1.5 MG/0.5 ML PEN: 1.5 | 28 days supply | Qty: 2 | Fill #1

## 2020-03-11 DIAGNOSIS — I8312 Varicose veins of left lower extremity with inflammation: Secondary | ICD-10-CM | POA: Diagnosis not present

## 2020-03-11 DIAGNOSIS — I8311 Varicose veins of right lower extremity with inflammation: Secondary | ICD-10-CM | POA: Diagnosis not present

## 2020-03-18 MED FILL — TRULICITY 1.5 MG/0.5 ML PEN: 1.5 | 84 days supply | Qty: 6 | Fill #2

## 2020-03-18 MED FILL — SYNJARDY XR 12.5-1,000 MG T: 12.5-1000 | 30 days supply | Qty: 60 | Fill #1

## 2020-03-19 DIAGNOSIS — Z23 Encounter for immunization: Secondary | ICD-10-CM | POA: Diagnosis not present

## 2020-03-22 ENCOUNTER — Other Ambulatory Visit (HOSPITAL_BASED_OUTPATIENT_CLINIC_OR_DEPARTMENT_OTHER): Payer: Self-pay | Admitting: Obstetrics and Gynecology

## 2020-03-22 MED FILL — MYRBETRIQ ER 25 MG TABLET: 25 | 30 days supply | Qty: 30 | Fill #0

## 2020-04-14 MED FILL — ATORVASTATIN 40 MG TABLET: 40 | 90 days supply | Qty: 90 | Fill #1

## 2020-04-14 MED FILL — SYNJARDY XR 12.5-1,000 MG T: 12.5-1000 | 30 days supply | Qty: 60 | Fill #2

## 2020-05-27 DIAGNOSIS — E119 Type 2 diabetes mellitus without complications: Secondary | ICD-10-CM | POA: Diagnosis not present

## 2020-06-07 DIAGNOSIS — I8312 Varicose veins of left lower extremity with inflammation: Secondary | ICD-10-CM | POA: Diagnosis not present

## 2020-06-07 DIAGNOSIS — I8311 Varicose veins of right lower extremity with inflammation: Secondary | ICD-10-CM | POA: Diagnosis not present

## 2020-06-13 ENCOUNTER — Other Ambulatory Visit (HOSPITAL_BASED_OUTPATIENT_CLINIC_OR_DEPARTMENT_OTHER): Payer: Self-pay | Admitting: Family Medicine

## 2020-06-21 ENCOUNTER — Other Ambulatory Visit: Payer: Self-pay

## 2020-06-21 ENCOUNTER — Emergency Department (INDEPENDENT_AMBULATORY_CARE_PROVIDER_SITE_OTHER)
Admission: RE | Admit: 2020-06-21 | Discharge: 2020-06-21 | Disposition: A | Discharge status: Home / Self Care | Payer: 59 | Source: Ambulatory Visit | Attending: Family Medicine | Admitting: Family Medicine

## 2020-06-21 VITALS — BP 128/86 | HR 81 | Temp 98.4°F | Resp 16

## 2020-06-21 DIAGNOSIS — N3091 Cystitis, unspecified with hematuria: Secondary | ICD-10-CM

## 2020-06-21 LAB — POCT URINALYSIS DIP (MANUAL ENTRY)
Glucose, UA: >=1000 mg/dL — AB
Nitrite, UA: NEGATIVE
Protein Ur, POC: >=300 mg/dL — AB
Spec Grav, UA: 1.015 (ref 1.010–1.025)
Urobilinogen, UA: 4 E.U./dL — AB
pH, UA: 6 (ref 5.0–8.0)

## 2020-06-21 MED ORDER — NITROFURANTOIN MONOHYD MACRO 100 MG PO CAPS
100.0000 mg | ORAL_CAPSULE | Freq: Two times a day (BID) | ORAL | 0 refills | Status: DC
Start: 2020-06-21 — End: 2021-06-10

## 2020-06-21 MED ORDER — TOLTERODINE TARTRATE ER 4 MG PO CP24: 4.0000 mg | ORAL_CAPSULE | Freq: Every day | ORAL | 0 refills | Status: DC

## 2020-06-21 MED ORDER — PHENAZOPYRIDINE HCL 200 MG PO TABS: 200.0000 mg | ORAL_TABLET | Freq: Three times a day (TID) | ORAL | 0 refills | Status: DC

## 2020-06-21 NOTE — Discharge Instructions (Addendum)
Take the antibiotic 2 times a day for a week.  The antibiotic is called Macrobid (nitrofurantoin Take the Pyridium 3 times a day until the burning stops.You will need it for only 2 to 3 days.  Remember it causes your urine to become orange  I have given you 30 days of Detrol LA (tolterodine) it is a medicine similar to oxybutynin and Myrbetriq but much cheaper.  If it works, call your doctor for refills

## 2020-06-21 NOTE — ED Triage Notes (Signed)
Patient presents to Urgent Care with complaints of urine frequency since today. Patient reports painful urination, pressure, blood in urine. Has not tried any medication for symptoms. Was previously on Myrbetriq last 2 weeks then stopped

## 2020-06-21 NOTE — ED Provider Notes (Signed)
[name]    CSN: [id number] Arrival date & time: 06/21/20  [place]      History   Chief Complaint Chief Complaint  Patient presents with  . Urinary Frequency    HPI Theresa Meyers is a 65 y.o. female.   HPI   States that she was taking Myrbetriq for over active bladder and incontinence issues, however, the Myrbetriq price is very high.  She quit taking it a couple weeks ago and intending to look up better prices for other medications but has not yet done so.  In the meantime she is developed a bladder infection.  It started today.  She has urinary frequency, burning, urinating small amounts of urine, and blood in her urine.  No fever or chills.  No abdominal pain or flank pain.  Past Medical History:  Diagnosis Date  . Anemia   . Colon polyp   . Diabetes mellitus   . Discoid lupus    Dr. Abner Greenspan  . Hypercholesteremia   . Vitamin D deficiency     Patient Active Problem List   Diagnosis Date Noted  . Urticaria 08/14/2017  . Seasonal and perennial allergic rhinitis 08/14/2017  . Allergic reaction 08/14/2017  . Type II or unspecified type diabetes mellitus without mention of complication, uncontrolled 03/09/2013  . Bunion 03/09/2013  . Plantar fasciitis, bilateral 03/09/2013  . Unspecified vitamin D deficiency 08/31/2012  . Type II or unspecified type diabetes mellitus without mention of complication, not stated as uncontrolled 08/31/2012  . HYPERLIPIDEMIA 08/05/2010  . ANGIOEDEMA 08/05/2010  . Discoid lupus 06/12/2010  . Obesity 06/12/2010  . Diabetes mellitus 10/31/2009    Past Surgical History:  Procedure Laterality Date  . BREAST EXCISIONAL BIOPSY Left    small area removed around areola, benign, many years ago  . CARPAL TUNNEL RELEASE    . FOOT SURGERY      OB History   No obstetric history on file.      Home Medications    Prior to Admission medications   Medication Sig Start Date End Date Taking? Authorizing Provider  atorvastatin  (LIPITOR) 40 MG tablet Take 1 tablet (40 mg total) by mouth daily. 11/06/13 08/14/17 Yes Zanard, Bernadene Bell, MD  nitrofurantoin, macrocrystal-monohydrate, (MACROBID) 100 MG capsule Take 1 capsule (100 mg total) by mouth 2 (two) times daily. 06/21/20  Yes Raylene Everts, MD  phenazopyridine (PYRIDIUM) 200 MG tablet Take 1 tablet (200 mg total) by mouth 3 (three) times daily. 06/21/20  Yes Raylene Everts, MD  SYNJARDY XR 12.07-998 MG TB24 Take 1 tablet by mouth 2 (two) times daily. 04/14/20  Yes [provider]  tolterodine (DETROL LA) 4 MG 24 hr capsule Take 1 capsule (4 mg total) by mouth daily. 06/21/20  Yes Raylene Everts, MD  TRULICITY 1.5 GY/6.9SW SOPN SMARTSIG:0.5 Milliliter(s) SUB-Q Once a Week 06/13/20  Yes [provider]  Blood Glucose Monitoring Suppl (FIFTY50 GLUCOSE METER 2.0) w/Device KIT Use to check blood sugar as directed 07/11/17   [provider]  EPINEPHrine (EPIPEN 2-PAK) 0.3 mg/0.3 mL IJ SOAJ injection Use as directed for severe allergic reactions 08/21/17   Bobbitt, Sedalia Muta, MD  fluticasone (FLONASE) 50 MCG/ACT nasal spray Place 1 spray into both nostrils 2 (two) times daily as needed for allergies or rhinitis. 08/14/17   Bobbitt, Sedalia Muta, MD  gabapentin (NEURONTIN) 100 MG capsule Take 100 mg by mouth 3 (three) times daily.    [provider]  HYDROcodone-acetaminophen (NORCO/VICODIN) 5-325 MG tablet  Take by mouth. 12/27/16   [provider]  Lancets (FREESTYLE) lancets Use to check blood sugar once daily    [provider]  levocetirizine (XYZAL) 5 MG tablet Take 1 tablet (5 mg total) by mouth every evening. Patient not taking: No sig reported 08/14/17   Bobbitt, Sedalia Muta, MD  meloxicam (MOBIC) 15 MG tablet Take 15 mg by mouth daily.    [provider]  mirabegron ER (MYRBETRIQ) 25 MG TB24 tablet Take by mouth. 07/26/17   [provider]  ranitidine (ZANTAC) 150 MG capsule Take 1 capsule (150 mg  total) by mouth 2 (two) times daily. Patient not taking: No sig reported 08/14/17   Bobbitt, Sedalia Muta, MD  sitaGLIPtin-metformin (JANUMET) 50-1000 MG tablet Take 1 tablet by mouth 2 (two) times daily with a meal.    [provider]  tobramycin-dexamethasone [facility]) ophthalmic solution  07/29/17   [provider]    Family History Family History  Problem Relation Age of Onset  . Diabetes Mother   . Hypertension Mother   . Dementia Mother   . Diabetes Father   . Hypertension Father   . Cancer Father        Colon  . Urticaria Father   . Rheum arthritis Sister   . Colon polyps Sister   . Urticaria Brother   . Allergic rhinitis Neg Hx   . Angioedema Neg Hx   . Asthma Neg Hx   . Eczema Neg Hx   . Immunodeficiency Neg Hx     Social History Social History   Tobacco Use  . Smoking status: Never Smoker  . Smokeless tobacco: Never Used  Vaping Use  . Vaping Use: Never used  Substance Use Topics  . Alcohol use: No  . Drug use: No     Allergies   Patient has no known allergies.   Review of Systems Review of Systems  See HPI Physical Exam Triage Vital Signs ED Triage Vitals  Enc Vitals Group     BP 06/21/20 1858 128/86     Pulse Rate 06/21/20 1858 81     Resp 06/21/20 1858 16     Temp 06/21/20 1858 98.4 F (36.9 C)     Temp Source 06/21/20 1858 Oral     SpO2 06/21/20 1858 97 %     Weight --      Height --      Head Circumference --      Peak Flow --      Pain Score 06/21/20 1853 0     Pain Loc --      Pain Edu? --      Excl. in [place]? --    No data found.  Updated Vital Signs BP 128/86 (BP Location: Right Arm)   Pulse 81   Temp 98.4 F (36.9 C) (Oral)   Resp 16   SpO2 97%       Physical Exam Constitutional:      General: She is not in acute distress.    Appearance: She is well-developed.     Comments: Pleasant.  Mildly overweight  HENT:     Head: Normocephalic and atraumatic.     Nose:     Comments: Ask is in place Eyes:      Conjunctiva/sclera: Conjunctivae normal.     Pupils: Pupils are equal, round, and reactive to light.  Cardiovascular:     Rate and Rhythm: Normal rate.  Pulmonary:     Effort: Pulmonary effort is normal.  No respiratory distress.  Abdominal:     General: There is no distension.     Palpations: Abdomen is soft.     Tenderness: There is no right CVA tenderness or left CVA tenderness.  Musculoskeletal:        General: Normal range of motion.     Cervical back: Normal range of motion.  Skin:    General: Skin is warm and dry.  Neurological:     Mental Status: She is alert.  Psychiatric:        Behavior: Behavior normal.      UC Treatments / Results  Labs (all labs ordered are listed, but only abnormal results are displayed) Labs Reviewed  POCT URINALYSIS DIP (MANUAL ENTRY) - Abnormal; Notable for the following components:      Result Value   Color, UA red (*)    Clarity, UA turbid (*)    Glucose, UA >=1,000 (*)    Bilirubin, UA large (*)    Ketones, POC UA moderate (40) (*)    Blood, UA large (*)    Protein Ur, POC >=300 (*)    Urobilinogen, UA 4.0 (*)    Leukocytes, UA Large (3+) (*)    All other components within normal limits  URINE CULTURE    EKG   Radiology No results found.  Procedures Procedures (including critical care time)  Medications Ordered in UC Medications - No data to display  Initial Impression / Assessment and Plan / UC Course  I have reviewed the triage vital signs and the nursing notes.  Pertinent labs & imaging results that were available during my care of the patient were reviewed by me and considered in my medical decision making (see chart for details).     Urine culture is pending.  I started her on Macrobid for the infection, Pyridium for discomfort.  Also gave her a trial of 30 days of Detrol.  If this works, great she can get refills from her primary care doctor.  If not she can look at alternate ways to finance her  Myrbetriq Final Clinical Impressions(s) / UC Diagnoses   Final diagnoses:  Cystitis with hematuria     Discharge Instructions     Take the antibiotic 2 times a day for a week.  The antibiotic is called Macrobid (nitrofurantoin Take the Pyridium 3 times a day until the burning stops.You will need it for only 2 to 3 days.  Remember it causes your urine to become orange  I have given you 30 days of Detrol LA (tolterodine) it is a medicine similar to oxybutynin and Myrbetriq but much cheaper.  If it works, call your doctor for refills    ED Prescriptions    Medication Sig Dispense Auth. Provider   phenazopyridine (PYRIDIUM) 200 MG tablet Take 1 tablet (200 mg total) by mouth 3 (three) times daily. 6 tablet Raylene Everts, MD   nitrofurantoin, macrocrystal-monohydrate, (MACROBID) 100 MG capsule Take 1 capsule (100 mg total) by mouth 2 (two) times daily. 14 capsule Raylene Everts, MD   tolterodine (DETROL LA) 4 MG 24 hr capsule Take 1 capsule (4 mg total) by mouth daily. 30 capsule Raylene Everts, MD     PDMP not reviewed this encounter.   Raylene Everts, MD 06/21/20 [phone]

## 2020-06-23 LAB — URINE CULTURE
MICRO NUMBER:: 11680262
Result:: NO GROWTH
SPECIMEN QUALITY:: ADEQUATE

## 2020-06-24 MED FILL — TRULICITY 1.5 MG/0.5 ML PEN: 1.5 | 28 days supply | Qty: 2 | Fill #0

## 2020-06-29 DIAGNOSIS — I8311 Varicose veins of right lower extremity with inflammation: Secondary | ICD-10-CM | POA: Diagnosis not present

## 2020-06-29 DIAGNOSIS — I8312 Varicose veins of left lower extremity with inflammation: Secondary | ICD-10-CM | POA: Diagnosis not present

## 2020-07-07 DIAGNOSIS — E785 Hyperlipidemia, unspecified: Secondary | ICD-10-CM | POA: Diagnosis not present

## 2020-07-07 DIAGNOSIS — R5383 Other fatigue: Secondary | ICD-10-CM | POA: Diagnosis not present

## 2020-07-07 DIAGNOSIS — E1165 Type 2 diabetes mellitus with hyperglycemia: Secondary | ICD-10-CM | POA: Diagnosis not present

## 2020-07-12 DIAGNOSIS — H18413 Arcus senilis, bilateral: Secondary | ICD-10-CM | POA: Diagnosis not present

## 2020-07-12 DIAGNOSIS — H25013 Cortical age-related cataract, bilateral: Secondary | ICD-10-CM | POA: Diagnosis not present

## 2020-07-12 DIAGNOSIS — H2513 Age-related nuclear cataract, bilateral: Secondary | ICD-10-CM | POA: Diagnosis not present

## 2020-07-12 DIAGNOSIS — H2511 Age-related nuclear cataract, right eye: Secondary | ICD-10-CM | POA: Diagnosis not present

## 2020-07-12 DIAGNOSIS — H25043 Posterior subcapsular polar age-related cataract, bilateral: Secondary | ICD-10-CM | POA: Diagnosis not present

## 2020-07-14 ENCOUNTER — Other Ambulatory Visit (HOSPITAL_BASED_OUTPATIENT_CLINIC_OR_DEPARTMENT_OTHER): Payer: Self-pay

## 2020-07-14 DIAGNOSIS — N3281 Overactive bladder: Secondary | ICD-10-CM | POA: Diagnosis not present

## 2020-07-14 DIAGNOSIS — Z91013 Allergy to seafood: Secondary | ICD-10-CM | POA: Diagnosis not present

## 2020-07-14 DIAGNOSIS — E1165 Type 2 diabetes mellitus with hyperglycemia: Secondary | ICD-10-CM | POA: Diagnosis not present

## 2020-07-14 MED ORDER — EPINEPHRINE 0.3 MG/0.3ML IJ SOAJ
INTRAMUSCULAR | 3 refills | Status: DC
  Filled 2020-07-14: qty 2, 30d supply, fill #0

## 2020-07-14 MED ORDER — TRULICITY 1.5 MG/0.5ML ~~LOC~~ SOAJ
SUBCUTANEOUS | 5 refills | Status: DC
  Filled 2020-07-14: qty 2, 28d supply, fill #0
  Filled 2020-07-25: qty 6, 84d supply, fill #0
  Filled 2020-10-19: qty 6, 84d supply, fill #1

## 2020-07-14 MED ORDER — SYNJARDY XR 12.5-1000 MG PO TB24
1.0000 | ORAL_TABLET | Freq: Two times a day (BID) | ORAL | 1 refills | Status: DC
  Filled 2020-07-14: qty 180, 90d supply, fill #0

## 2020-07-14 MED ORDER — TOLTERODINE TARTRATE ER 4 MG PO CP24
ORAL_CAPSULE | ORAL | 3 refills | Status: DC
  Filled 2020-07-14 – 2020-07-25 (×2): qty 90, 90d supply, fill #0
  Filled 2020-11-04: qty 30, 30d supply, fill #1
  Filled 2020-12-07: qty 90, 90d supply, fill #2
  Filled 2020-12-30 – 2021-02-17 (×3): qty 90, 90d supply, fill #3

## 2020-07-25 ENCOUNTER — Other Ambulatory Visit (HOSPITAL_BASED_OUTPATIENT_CLINIC_OR_DEPARTMENT_OTHER): Payer: Self-pay

## 2020-08-05 DIAGNOSIS — H2511 Age-related nuclear cataract, right eye: Secondary | ICD-10-CM | POA: Diagnosis not present

## 2020-08-05 DIAGNOSIS — H2512 Age-related nuclear cataract, left eye: Secondary | ICD-10-CM | POA: Diagnosis not present

## 2020-08-15 ENCOUNTER — Other Ambulatory Visit (HOSPITAL_BASED_OUTPATIENT_CLINIC_OR_DEPARTMENT_OTHER): Payer: Self-pay

## 2020-08-15 MED FILL — Atorvastatin Calcium Tab 40 MG (Base Equivalent): ORAL | 90 days supply | Qty: 90 | Fill #0 | Status: AC

## 2020-08-19 ENCOUNTER — Other Ambulatory Visit (HOSPITAL_BASED_OUTPATIENT_CLINIC_OR_DEPARTMENT_OTHER): Payer: Self-pay

## 2020-08-19 DIAGNOSIS — H2512 Age-related nuclear cataract, left eye: Secondary | ICD-10-CM | POA: Diagnosis not present

## 2020-09-15 ENCOUNTER — Other Ambulatory Visit (HOSPITAL_BASED_OUTPATIENT_CLINIC_OR_DEPARTMENT_OTHER): Payer: Self-pay

## 2020-09-15 MED ORDER — GLUCOSE BLOOD VI STRP
ORAL_STRIP | 11 refills | Status: AC
  Filled 2020-09-15: qty 50, 50d supply, fill #0

## 2020-09-16 ENCOUNTER — Other Ambulatory Visit (HOSPITAL_BASED_OUTPATIENT_CLINIC_OR_DEPARTMENT_OTHER): Payer: Self-pay

## 2020-10-06 DIAGNOSIS — Z961 Presence of intraocular lens: Secondary | ICD-10-CM | POA: Diagnosis not present

## 2020-10-07 ENCOUNTER — Other Ambulatory Visit (HOSPITAL_COMMUNITY): Payer: Self-pay

## 2020-10-19 ENCOUNTER — Other Ambulatory Visit (HOSPITAL_BASED_OUTPATIENT_CLINIC_OR_DEPARTMENT_OTHER): Payer: Self-pay

## 2020-10-21 ENCOUNTER — Other Ambulatory Visit (HOSPITAL_BASED_OUTPATIENT_CLINIC_OR_DEPARTMENT_OTHER): Payer: Self-pay

## 2020-11-03 ENCOUNTER — Other Ambulatory Visit (HOSPITAL_BASED_OUTPATIENT_CLINIC_OR_DEPARTMENT_OTHER): Payer: Self-pay

## 2020-11-03 DIAGNOSIS — Z800 Family history of malignant neoplasm of digestive organs: Secondary | ICD-10-CM | POA: Diagnosis not present

## 2020-11-03 DIAGNOSIS — Z1211 Encounter for screening for malignant neoplasm of colon: Secondary | ICD-10-CM | POA: Diagnosis not present

## 2020-11-03 MED ORDER — ATORVASTATIN CALCIUM 40 MG PO TABS
ORAL_TABLET | ORAL | 3 refills | Status: DC
  Filled 2020-11-03: qty 90, 90d supply, fill #0
  Filled 2021-03-10: qty 90, 90d supply, fill #1
  Filled 2021-05-29: qty 90, 90d supply, fill #2

## 2020-11-03 MED ORDER — TRULICITY 1.5 MG/0.5ML ~~LOC~~ SOAJ
SUBCUTANEOUS | 5 refills | Status: DC
  Filled 2020-11-03: qty 2, 28d supply, fill #0
  Filled 2021-02-03: qty 6, 84d supply, fill #0
  Filled 2021-05-19: qty 6, 84d supply, fill #1

## 2020-11-03 MED ORDER — SYNJARDY XR 12.5-1000 MG PO TB24
1.0000 | ORAL_TABLET | Freq: Two times a day (BID) | ORAL | 1 refills | Status: DC
  Filled 2020-11-03 – 2020-11-04 (×2): qty 180, 90d supply, fill #0
  Filled 2021-08-10: qty 180, 90d supply, fill #1

## 2020-11-04 ENCOUNTER — Other Ambulatory Visit (HOSPITAL_BASED_OUTPATIENT_CLINIC_OR_DEPARTMENT_OTHER): Payer: Self-pay

## 2020-11-07 ENCOUNTER — Other Ambulatory Visit (HOSPITAL_BASED_OUTPATIENT_CLINIC_OR_DEPARTMENT_OTHER): Payer: Self-pay

## 2020-11-07 MED ORDER — CLENPIQ 10-3.5-12 MG-GM -GM/160ML PO SOLN
ORAL | 0 refills | Status: DC
  Filled 2020-11-07 – 2020-12-30 (×2): qty 320, 1d supply, fill #0

## 2020-11-14 ENCOUNTER — Other Ambulatory Visit (HOSPITAL_BASED_OUTPATIENT_CLINIC_OR_DEPARTMENT_OTHER): Payer: Self-pay

## 2020-12-07 ENCOUNTER — Other Ambulatory Visit (HOSPITAL_BASED_OUTPATIENT_CLINIC_OR_DEPARTMENT_OTHER): Payer: Self-pay

## 2020-12-08 ENCOUNTER — Other Ambulatory Visit (HOSPITAL_BASED_OUTPATIENT_CLINIC_OR_DEPARTMENT_OTHER): Payer: Self-pay

## 2020-12-30 ENCOUNTER — Other Ambulatory Visit (HOSPITAL_BASED_OUTPATIENT_CLINIC_OR_DEPARTMENT_OTHER): Payer: Self-pay

## 2021-01-02 DIAGNOSIS — Z800 Family history of malignant neoplasm of digestive organs: Secondary | ICD-10-CM | POA: Diagnosis not present

## 2021-01-02 DIAGNOSIS — K635 Polyp of colon: Secondary | ICD-10-CM | POA: Diagnosis not present

## 2021-01-02 DIAGNOSIS — D128 Benign neoplasm of rectum: Secondary | ICD-10-CM | POA: Diagnosis not present

## 2021-01-02 DIAGNOSIS — Z1211 Encounter for screening for malignant neoplasm of colon: Secondary | ICD-10-CM | POA: Diagnosis not present

## 2021-01-02 DIAGNOSIS — K621 Rectal polyp: Secondary | ICD-10-CM | POA: Diagnosis not present

## 2021-01-11 ENCOUNTER — Other Ambulatory Visit (HOSPITAL_BASED_OUTPATIENT_CLINIC_OR_DEPARTMENT_OTHER): Payer: Self-pay | Admitting: Family Medicine

## 2021-01-11 DIAGNOSIS — Z1231 Encounter for screening mammogram for malignant neoplasm of breast: Secondary | ICD-10-CM

## 2021-01-25 ENCOUNTER — Other Ambulatory Visit (HOSPITAL_BASED_OUTPATIENT_CLINIC_OR_DEPARTMENT_OTHER): Payer: Self-pay

## 2021-01-25 MED ORDER — INFLUENZA VAC SPLIT QUAD 0.5 ML IM SUSY
PREFILLED_SYRINGE | INTRAMUSCULAR | 0 refills | Status: DC
  Filled 2021-01-25: qty 0.5, 1d supply, fill #0

## 2021-02-03 ENCOUNTER — Other Ambulatory Visit (HOSPITAL_BASED_OUTPATIENT_CLINIC_OR_DEPARTMENT_OTHER): Payer: Self-pay

## 2021-02-17 ENCOUNTER — Other Ambulatory Visit (HOSPITAL_BASED_OUTPATIENT_CLINIC_OR_DEPARTMENT_OTHER): Payer: Self-pay

## 2021-02-21 ENCOUNTER — Other Ambulatory Visit: Payer: Self-pay

## 2021-02-21 ENCOUNTER — Encounter (HOSPITAL_BASED_OUTPATIENT_CLINIC_OR_DEPARTMENT_OTHER): Payer: Self-pay

## 2021-02-21 ENCOUNTER — Ambulatory Visit (HOSPITAL_BASED_OUTPATIENT_CLINIC_OR_DEPARTMENT_OTHER)
Admission: RE | Admit: 2021-02-21 | Discharge: 2021-02-21 | Disposition: A | Discharge status: Home / Self Care | Payer: 59 | Source: Ambulatory Visit | Attending: Family Medicine | Admitting: Family Medicine

## 2021-02-21 DIAGNOSIS — Z1231 Encounter for screening mammogram for malignant neoplasm of breast: Secondary | ICD-10-CM | POA: Insufficient documentation

## 2021-03-10 ENCOUNTER — Other Ambulatory Visit (HOSPITAL_BASED_OUTPATIENT_CLINIC_OR_DEPARTMENT_OTHER): Payer: Self-pay

## 2021-03-16 ENCOUNTER — Other Ambulatory Visit (HOSPITAL_BASED_OUTPATIENT_CLINIC_OR_DEPARTMENT_OTHER): Payer: Self-pay

## 2021-03-16 MED ORDER — AMOXICILLIN 500 MG PO CAPS
ORAL_CAPSULE | ORAL | 0 refills | Status: DC
  Filled 2021-03-16: qty 25, 6d supply, fill #0

## 2021-03-29 ENCOUNTER — Other Ambulatory Visit (HOSPITAL_BASED_OUTPATIENT_CLINIC_OR_DEPARTMENT_OTHER): Payer: Self-pay

## 2021-03-29 DIAGNOSIS — Z79899 Other long term (current) drug therapy: Secondary | ICD-10-CM | POA: Diagnosis not present

## 2021-03-29 DIAGNOSIS — Z01411 Encounter for gynecological examination (general) (routine) with abnormal findings: Secondary | ICD-10-CM | POA: Diagnosis not present

## 2021-03-29 DIAGNOSIS — R3989 Other symptoms and signs involving the genitourinary system: Secondary | ICD-10-CM | POA: Diagnosis not present

## 2021-03-29 MED ORDER — TOLTERODINE TARTRATE ER 4 MG PO CP24
4.0000 mg | ORAL_CAPSULE | Freq: Every evening | ORAL | 3 refills | Status: DC
  Filled 2021-03-29: qty 90, 90d supply, fill #0

## 2021-03-29 MED ORDER — FLUCONAZOLE 150 MG PO TABS
ORAL_TABLET | ORAL | 1 refills | Status: DC
  Filled 2021-03-29: qty 3, 3d supply, fill #0

## 2021-04-03 ENCOUNTER — Other Ambulatory Visit (HOSPITAL_BASED_OUTPATIENT_CLINIC_OR_DEPARTMENT_OTHER): Payer: Self-pay

## 2021-04-03 MED ORDER — TOLTERODINE TARTRATE ER 4 MG PO CP24
ORAL_CAPSULE | ORAL | 3 refills | Status: DC
  Filled 2021-04-03 – 2021-05-29 (×2): qty 90, 90d supply, fill #0
  Filled 2021-09-07: qty 90, 90d supply, fill #1
  Filled 2021-12-08: qty 90, 90d supply, fill #2
  Filled 2022-04-11: qty 5, 5d supply, fill #3
  Filled 2022-04-11: qty 3, 3d supply, fill #3

## 2021-04-11 ENCOUNTER — Other Ambulatory Visit (HOSPITAL_BASED_OUTPATIENT_CLINIC_OR_DEPARTMENT_OTHER): Payer: Self-pay

## 2021-05-19 ENCOUNTER — Other Ambulatory Visit (HOSPITAL_BASED_OUTPATIENT_CLINIC_OR_DEPARTMENT_OTHER): Payer: Self-pay

## 2021-05-29 ENCOUNTER — Ambulatory Visit: Payer: 59 | Attending: Internal Medicine

## 2021-05-29 ENCOUNTER — Other Ambulatory Visit (HOSPITAL_BASED_OUTPATIENT_CLINIC_OR_DEPARTMENT_OTHER): Payer: Self-pay

## 2021-05-29 DIAGNOSIS — Z23 Encounter for immunization: Secondary | ICD-10-CM

## 2021-05-29 MED ORDER — PFIZER COVID-19 VAC BIVALENT 30 MCG/0.3ML IM SUSP
INTRAMUSCULAR | 0 refills | Status: DC
  Filled 2021-05-29: qty 0.3, 1d supply, fill #0

## 2021-05-29 NOTE — Progress Notes (Signed)
° °  Covid-19 Vaccination Clinic  Name:  SERENE KOPF    MRN: [id number] DOB: [birth year]  05/29/2021  Ms. Guthridge was observed post Covid-19 immunization for 15 minutes without incident. She was provided with Vaccine Information Sheet and instruction to access the V-Safe system.   Ms. Zmuda was instructed to call 911 with any severe reactions post vaccine: Difficulty breathing  Swelling of face and throat  A fast heartbeat  A bad rash all over body  Dizziness and weakness   Immunizations Administered     Name Date Dose VIS Date Route   Pfizer Covid-19 Vaccine Bivalent Booster 05/29/2021  1:49 PM 0.3 mL 11/30/2020 Intramuscular   Manufacturer: [place]   Lot: [id number]   [place]: [id number]

## 2021-06-10 ENCOUNTER — Encounter (HOSPITAL_BASED_OUTPATIENT_CLINIC_OR_DEPARTMENT_OTHER): Payer: Self-pay | Admitting: Emergency Medicine

## 2021-06-10 ENCOUNTER — Emergency Department (HOSPITAL_BASED_OUTPATIENT_CLINIC_OR_DEPARTMENT_OTHER)
Admission: EM | Admit: 2021-06-10 | Discharge: 2021-06-10 | Disposition: A | Discharge status: Home / Self Care | Payer: 59 | Attending: Emergency Medicine | Admitting: Emergency Medicine

## 2021-06-10 ENCOUNTER — Other Ambulatory Visit: Payer: Self-pay

## 2021-06-10 DIAGNOSIS — S39012A Strain of muscle, fascia and tendon of lower back, initial encounter: Secondary | ICD-10-CM | POA: Diagnosis not present

## 2021-06-10 DIAGNOSIS — E119 Type 2 diabetes mellitus without complications: Secondary | ICD-10-CM | POA: Insufficient documentation

## 2021-06-10 DIAGNOSIS — S161XXA Strain of muscle, fascia and tendon at neck level, initial encounter: Secondary | ICD-10-CM | POA: Insufficient documentation

## 2021-06-10 DIAGNOSIS — T148XXA Other injury of unspecified body region, initial encounter: Secondary | ICD-10-CM

## 2021-06-10 DIAGNOSIS — Y92410 Unspecified street and highway as the place of occurrence of the external cause: Secondary | ICD-10-CM | POA: Insufficient documentation

## 2021-06-10 MED ORDER — CYCLOBENZAPRINE HCL 10 MG PO TABS: 10.0000 mg | ORAL_TABLET | Freq: Three times a day (TID) | ORAL | 0 refills | Status: DC | PRN

## 2021-06-10 MED ORDER — MELOXICAM 15 MG PO TABS: ORAL_TABLET | ORAL | 0 refills | Status: DC

## 2021-06-10 NOTE — ED Notes (Signed)
Dr. Molpus at bedside. 
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
## 2021-08-10 ENCOUNTER — Other Ambulatory Visit (HOSPITAL_BASED_OUTPATIENT_CLINIC_OR_DEPARTMENT_OTHER): Payer: Self-pay

## 2021-08-11 DIAGNOSIS — E785 Hyperlipidemia, unspecified: Secondary | ICD-10-CM | POA: Diagnosis not present

## 2021-08-11 DIAGNOSIS — E1165 Type 2 diabetes mellitus with hyperglycemia: Secondary | ICD-10-CM | POA: Diagnosis not present

## 2021-08-17 ENCOUNTER — Other Ambulatory Visit (HOSPITAL_BASED_OUTPATIENT_CLINIC_OR_DEPARTMENT_OTHER): Payer: Self-pay

## 2021-08-17 DIAGNOSIS — E1165 Type 2 diabetes mellitus with hyperglycemia: Secondary | ICD-10-CM | POA: Diagnosis not present

## 2021-08-17 DIAGNOSIS — Z91013 Allergy to seafood: Secondary | ICD-10-CM | POA: Diagnosis not present

## 2021-08-17 DIAGNOSIS — L236 Allergic contact dermatitis due to food in contact with the skin: Secondary | ICD-10-CM | POA: Diagnosis not present

## 2021-08-17 DIAGNOSIS — E782 Mixed hyperlipidemia: Secondary | ICD-10-CM | POA: Diagnosis not present

## 2021-08-17 DIAGNOSIS — Z7689 Persons encountering health services in other specified circumstances: Secondary | ICD-10-CM | POA: Diagnosis not present

## 2021-08-17 MED ORDER — EPINEPHRINE 0.3 MG/0.3ML IJ SOAJ
INTRAMUSCULAR | 1 refills | Status: DC
  Filled 2021-08-17: qty 2, 2d supply, fill #0

## 2021-08-17 MED ORDER — ATORVASTATIN CALCIUM 40 MG PO TABS
ORAL_TABLET | ORAL | 1 refills | Status: DC
  Filled 2021-08-17: qty 90, 90d supply, fill #0
  Filled 2021-12-08: qty 90, 90d supply, fill #1

## 2021-08-17 MED ORDER — TRULICITY 1.5 MG/0.5ML ~~LOC~~ SOAJ
SUBCUTANEOUS | 1 refills | Status: DC
  Filled 2021-08-17: qty 6, 84d supply, fill #0
  Filled 2021-12-08: qty 6, 84d supply, fill #1

## 2021-08-17 MED ORDER — SYNJARDY XR 12.5-1000 MG PO TB24
1.0000 | ORAL_TABLET | Freq: Every day | ORAL | 1 refills | Status: DC
  Filled 2021-08-17 – 2021-08-18 (×2): qty 90, 90d supply, fill #0
  Filled 2021-12-08: qty 90, 90d supply, fill #1

## 2021-08-17 MED ORDER — TRIAMCINOLONE ACETONIDE 0.1 % EX CREA
TOPICAL_CREAM | CUTANEOUS | 0 refills | Status: DC
  Filled 2021-08-17: qty 30, 14d supply, fill #0

## 2021-08-18 ENCOUNTER — Other Ambulatory Visit (HOSPITAL_BASED_OUTPATIENT_CLINIC_OR_DEPARTMENT_OTHER): Payer: Self-pay

## 2021-09-07 ENCOUNTER — Other Ambulatory Visit (HOSPITAL_BASED_OUTPATIENT_CLINIC_OR_DEPARTMENT_OTHER): Payer: Self-pay

## 2021-10-03 ENCOUNTER — Other Ambulatory Visit: Payer: Self-pay

## 2021-10-03 ENCOUNTER — Emergency Department (HOSPITAL_BASED_OUTPATIENT_CLINIC_OR_DEPARTMENT_OTHER)
Admission: EM | Admit: 2021-10-03 | Discharge: 2021-10-03 | Disposition: A | Discharge status: Home / Self Care | Payer: 59 | Attending: Emergency Medicine | Admitting: Emergency Medicine

## 2021-10-03 ENCOUNTER — Encounter (HOSPITAL_BASED_OUTPATIENT_CLINIC_OR_DEPARTMENT_OTHER): Payer: Self-pay | Admitting: Emergency Medicine

## 2021-10-03 DIAGNOSIS — R1110 Vomiting, unspecified: Secondary | ICD-10-CM | POA: Diagnosis present

## 2021-10-03 DIAGNOSIS — K529 Noninfective gastroenteritis and colitis, unspecified: Secondary | ICD-10-CM | POA: Diagnosis not present

## 2021-10-03 DIAGNOSIS — Z7984 Long term (current) use of oral hypoglycemic drugs: Secondary | ICD-10-CM | POA: Insufficient documentation

## 2021-10-03 DIAGNOSIS — E119 Type 2 diabetes mellitus without complications: Secondary | ICD-10-CM | POA: Diagnosis not present

## 2021-10-03 MED ORDER — ONDANSETRON 8 MG PO TBDP: 8.0000 mg | ORAL_TABLET | Freq: Three times a day (TID) | ORAL | 0 refills | Status: DC | PRN

## 2021-10-03 MED ORDER — LACTATED RINGERS IV BOLUS
1000.0000 mL | Freq: Once | INTRAVENOUS | Status: AC
  Administered 2021-10-03: 1000 mL via INTRAVENOUS

## 2021-10-03 NOTE — ED Triage Notes (Signed)
Pt states she thinks she may have food poisoning  Pt states it started late Friday early Saturday  Pt states she has had N/V/D since

## 2021-10-03 NOTE — ED Notes (Signed)
Patient verbalizes understanding of discharge instructions. Opportunity for questioning and answers were provided. Armband removed by staff, pt discharged from ED. Ambulated out to lobby  

## 2021-10-03 NOTE — ED Provider Notes (Signed)
[facility] Provider Note: Georgena Spurling, MD, FACEP  CSN: [id number] MRN: [id number] ARRIVAL: 10/03/21 at [place]  10/03/21 2:37 AM Theresa Meyers is a 66 y.o. female with nausea, vomiting and diarrhea for the past 3 days.  She has had no associated pain.  She feels generalized weakness and has a decreased appetite but the nausea and vomiting was resolved.  The diarrhea continues.   Past Medical History:  Diagnosis Date   Anemia    Colon polyp    Diabetes mellitus    Discoid lupus    Dr. Abner Greenspan   Hypercholesteremia    Vitamin D deficiency     Past Surgical History:  Procedure Laterality Date   BREAST EXCISIONAL BIOPSY Left    small area removed around areola, benign, many years ago   CARPAL TUNNEL RELEASE     FOOT SURGERY      Family History  Problem Relation Age of Onset   Diabetes Mother    Hypertension Mother    Dementia Mother    Diabetes Father    Hypertension Father    Cancer Father        Colon   Urticaria Father    Rheum arthritis Sister    Colon polyps Sister    Urticaria Brother    Allergic rhinitis Neg Hx    Angioedema Neg Hx    Asthma Neg Hx    Eczema Neg Hx    Immunodeficiency Neg Hx     Social History   Tobacco Use   Smoking status: Never   Smokeless tobacco: Never  Vaping Use   Vaping Use: Never used  Substance Use Topics   Alcohol use: No   Drug use: No    Prior to Admission medications   Medication Sig Start Date End Date Taking? Authorizing Provider  ondansetron (ZOFRAN-ODT) 8 MG disintegrating tablet Take 1 tablet (8 mg total) by mouth every 8 (eight) hours as needed. 10/03/21  Yes Sparrow Sanzo, MD  atorvastatin (LIPITOR) 40 MG tablet Take 1 tablet (40 mg total) by mouth nightly. 08/17/21     Blood Glucose Monitoring Suppl (FIFTY50 GLUCOSE METER 2.0) w/Device KIT Use to check blood sugar as directed 07/11/17   [provider]  cyclobenzaprine  (FLEXERIL) 10 MG tablet Take 1 tablet (10 mg total) by mouth 3 (three) times daily as needed for muscle spasms. 06/10/21   Dugan Vanhoesen, MD  EPINEPHrine 0.3 mg/0.3 mL IJ SOAJ injection Inject 0.7ms into the thigh muscle as needed for anaphylaxsis. May repeat dose once after 5 - 15 minutes if anaphylatic symptoms do not improve. Seek medical/emergency assitance. 08/17/21     fluticasone (FLONASE) 50 MCG/ACT nasal spray Place 1 spray into both nostrils 2 (two) times daily as needed for allergies or rhinitis. 08/14/17   Bobbitt, RSedalia Muta MD  gabapentin (NEURONTIN) 100 MG capsule Take 100 mg by mouth 3 (three) times daily.    [provider]  glucose blood test strip Use to check blood sugar once daily 09/15/20     Lancets (FREESTYLE) lancets Use to check blood sugar once daily    [provider]  meloxicam (MOBIC) 15 MG tablet Take 1 tablet daily as needed for pain. 06/10/21   Rebecka Oelkers, MD  mirabegron ER (MYRBETRIQ) 25 MG TB24 tablet TAKE 1 TABLET BY MOUTH ONCE DAILY 03/22/20 03/22/21  OSheldon Silvan, MD  phenazopyridine (PYRIDIUM) 200 MG tablet Take  1 tablet (200 mg total) by mouth 3 (three) times daily. 06/21/20   Raylene Everts, MD  sitaGLIPtin-metformin (JANUMET) 50-1000 MG tablet Take 1 tablet by mouth 2 (two) times daily with a meal.    [provider]  Sod Picosulfate-Mag Ox-Cit Acd (CLENPIQ) 10-3.5-12 MG-GM -GM/160ML SOLN Take as directed by mouth, see prep sheet for instructions 11/07/20     SYNJARDY XR 12.07-998 MG TB24 TAKE 1 TABLET BY MOUTH 2 TIMES DAILY. 11/03/20     SYNJARDY XR 12.07-998 MG TB24 Take 1 tablet by mouth daily. 08/17/21     tolterodine (DETROL LA) 4 MG 24 hr capsule Take 1 capsule (4 mg total) by mouth nightly. 04/03/21     triamcinolone cream (KENALOG) 0.1 % Apply to rash sparingly twice daily as needed for up to 14 days. 06/21/00     TRULICITY 1.5 RK/2.7CW SOPN Inject 0.5 mLs (1.5 mg total) into the skin every 7 days. 08/17/21        Allergies Patient has no known allergies.   REVIEW OF SYSTEMS  Negative except as noted here or in the History of Present Illness.   PHYSICAL EXAMINATION  Initial Vital Signs Blood pressure (!) 128/92, pulse (!) 103, temperature 98.5 F (36.9 C), temperature source Oral, resp. rate 14, height _0  (1.6 m), weight 83.9 kg, SpO2 98 %.  Examination General: Well-developed, well-nourished female in no acute distress; appearance consistent with age of record HENT: normocephalic; atraumatic Eyes: pupils equal, round and reactive to light; extraocular muscles intact Neck: supple Heart: regular rate and rhythm Lungs: clear to auscultation bilaterally Abdomen: soft; nondistended; nontender; no masses or hepatosplenomegaly; bowel sounds present Extremities: No deformity; full range of motion; pulses normal Neurologic: Awake, alert and oriented; motor function intact in all extremities and symmetric; no facial droop Skin: Warm and dry Psychiatric: Normal mood and affect   RESULTS  Summary of this visit's results, reviewed and interpreted by myself:   EKG Interpretation  Date/Time:    Ventricular Rate:    PR Interval:    QRS Duration:   QT Interval:    QTC Calculation:   R Axis:     Text Interpretation:         Laboratory Studies: No results found for this or any previous visit (from the past 24 hour(s)). Imaging Studies: No results found.  ED COURSE and MDM  Nursing notes, initial and subsequent vitals signs, including pulse oximetry, reviewed and interpreted by myself.  Vitals:   10/03/21 0032 10/03/21 0036 10/03/21 0338 10/03/21 0345  BP:  (!) 128/92 119/82   Pulse:  (!) 103 84 82  Resp:  14 18   Temp:  98.5 F (36.9 C)    TempSrc:  Oral    SpO2:  98% 98% 98%  Weight: 83.9 kg     Height: _1  (1.6 m)      Medications  lactated ringers bolus 1,000 mL (1,000 mLs Intravenous New Bag/Given 10/03/21 0336)   4:44 AM Patient feeling better after IV fluid  bolus.  She still denies nausea.  She was advised she can take over-the-counter Imodium or Pepto-Bismol as needed for diarrhea.  Presentation is consistent with a viral gastroenteritis or possibly food poisoning.   PROCEDURES  Procedures   ED DIAGNOSES     ICD-10-CM   1. Gastroenteritis  K52.9          Brendon Christoffel, MD 10/03/21 [phone]

## 2021-12-08 ENCOUNTER — Other Ambulatory Visit (HOSPITAL_BASED_OUTPATIENT_CLINIC_OR_DEPARTMENT_OTHER): Payer: Self-pay

## 2022-01-17 ENCOUNTER — Other Ambulatory Visit (HOSPITAL_BASED_OUTPATIENT_CLINIC_OR_DEPARTMENT_OTHER): Payer: Self-pay

## 2022-01-17 MED ORDER — FLUARIX QUADRIVALENT 0.5 ML IM SUSY
PREFILLED_SYRINGE | INTRAMUSCULAR | 0 refills | Status: DC
  Filled 2022-01-17: qty 0.5, 1d supply, fill #0

## 2022-01-19 DIAGNOSIS — H524 Presbyopia: Secondary | ICD-10-CM | POA: Diagnosis not present

## 2022-02-12 DIAGNOSIS — E1165 Type 2 diabetes mellitus with hyperglycemia: Secondary | ICD-10-CM | POA: Diagnosis not present

## 2022-02-12 DIAGNOSIS — R5383 Other fatigue: Secondary | ICD-10-CM | POA: Diagnosis not present

## 2022-02-12 DIAGNOSIS — E782 Mixed hyperlipidemia: Secondary | ICD-10-CM | POA: Diagnosis not present

## 2022-02-21 ENCOUNTER — Other Ambulatory Visit (HOSPITAL_BASED_OUTPATIENT_CLINIC_OR_DEPARTMENT_OTHER): Payer: Self-pay

## 2022-02-21 DIAGNOSIS — I8393 Asymptomatic varicose veins of bilateral lower extremities: Secondary | ICD-10-CM | POA: Diagnosis not present

## 2022-02-21 DIAGNOSIS — Z1231 Encounter for screening mammogram for malignant neoplasm of breast: Secondary | ICD-10-CM | POA: Diagnosis not present

## 2022-02-21 DIAGNOSIS — E782 Mixed hyperlipidemia: Secondary | ICD-10-CM | POA: Diagnosis not present

## 2022-02-21 DIAGNOSIS — Z09 Encounter for follow-up examination after completed treatment for conditions other than malignant neoplasm: Secondary | ICD-10-CM | POA: Diagnosis not present

## 2022-02-21 DIAGNOSIS — E1165 Type 2 diabetes mellitus with hyperglycemia: Secondary | ICD-10-CM | POA: Diagnosis not present

## 2022-02-21 MED ORDER — ATORVASTATIN CALCIUM 40 MG PO TABS
40.0000 mg | ORAL_TABLET | Freq: Every day | ORAL | 1 refills | Status: DC
  Filled 2022-02-21: qty 90, 90d supply, fill #0
  Filled 2022-07-17: qty 90, 90d supply, fill #1

## 2022-02-21 MED ORDER — SYNJARDY XR 12.5-1000 MG PO TB24
1.0000 | ORAL_TABLET | Freq: Every day | ORAL | 1 refills | Status: DC
  Filled 2022-02-21: qty 90, 90d supply, fill #0
  Filled 2022-07-17 – 2022-07-18 (×2): qty 90, 90d supply, fill #1

## 2022-02-21 MED ORDER — TRULICITY 1.5 MG/0.5ML ~~LOC~~ SOAJ
1.5000 mg | SUBCUTANEOUS | 1 refills | Status: DC
  Filled 2022-02-21: qty 6, 84d supply, fill #0
  Filled 2022-07-17: qty 2, 28d supply, fill #1
  Filled 2022-07-20: qty 6, 84d supply, fill #1
  Filled 2022-07-20: qty 2, 28d supply, fill #1
  Filled 2022-07-26: qty 6, 84d supply, fill #1
  Filled 2022-07-27: qty 2, 28d supply, fill #1
  Filled 2022-09-07 – 2022-10-08 (×3): qty 2, 28d supply, fill #2

## 2022-03-03 ENCOUNTER — Other Ambulatory Visit: Payer: Self-pay

## 2022-03-03 ENCOUNTER — Ambulatory Visit
Admission: EM | Admit: 2022-03-03 | Discharge: 2022-03-03 | Disposition: A | Discharge status: Home / Self Care | Payer: 59 | Attending: Family Medicine | Admitting: Family Medicine

## 2022-03-03 DIAGNOSIS — L930 Discoid lupus erythematosus: Secondary | ICD-10-CM | POA: Insufficient documentation

## 2022-03-03 DIAGNOSIS — E669 Obesity, unspecified: Secondary | ICD-10-CM | POA: Insufficient documentation

## 2022-03-03 DIAGNOSIS — Z6832 Body mass index (BMI) 32.0-32.9, adult: Secondary | ICD-10-CM | POA: Diagnosis not present

## 2022-03-03 DIAGNOSIS — E119 Type 2 diabetes mellitus without complications: Secondary | ICD-10-CM | POA: Diagnosis not present

## 2022-03-03 DIAGNOSIS — R81 Glycosuria: Secondary | ICD-10-CM | POA: Diagnosis not present

## 2022-03-03 DIAGNOSIS — N3001 Acute cystitis with hematuria: Secondary | ICD-10-CM | POA: Diagnosis not present

## 2022-03-03 LAB — POCT URINALYSIS DIP (MANUAL ENTRY)
Bilirubin, UA: NEGATIVE
Glucose, UA: >=1000 mg/dL — AB
Ketones, POC UA: NEGATIVE mg/dL
Nitrite, UA: NEGATIVE
Protein Ur, POC: 30 mg/dL — AB
Spec Grav, UA: 1.025 (ref 1.010–1.025)
Urobilinogen, UA: 0.2 E.U./dL
pH, UA: 5.5 (ref 5.0–8.0)

## 2022-03-03 MED ORDER — NITROFURANTOIN MONOHYD MACRO 100 MG PO CAPS: 100.0000 mg | ORAL_CAPSULE | Freq: Two times a day (BID) | ORAL | 0 refills | Status: AC

## 2022-03-03 NOTE — Discharge Instructions (Addendum)
Advised patient to take medication as directed with food to completion.  Encouraged patient increase daily water intake to 64 ounces per day while taking this medication.  Advised we will follow-up with a urine culture results once received.

## 2022-03-03 NOTE — ED Triage Notes (Signed)
Blood in urine and pressure when urinating onset since yesterday.

## 2022-03-03 NOTE — ED Provider Notes (Signed)
[name]    CSN: [id number] Arrival date & time: 03/03/22  1428      History   Chief Complaint Chief Complaint  Patient presents with   Hematuria    HPI Theresa Meyers is a 66 y.o. female.   HPI Very pleasant 66 year old female presents with urinary pressure and hematuria for 1 day.  PMH significant for obesity, anemia, T2DM and discoid lupus.  Past Medical History:  Diagnosis Date   Anemia    Colon polyp    Diabetes mellitus    Discoid lupus    Dr. Abner Greenspan   Hypercholesteremia    Vitamin D deficiency     Patient Active Problem List   Diagnosis Date Noted   Urticaria 08/14/2017   Seasonal and perennial allergic rhinitis 08/14/2017   Allergic reaction 08/14/2017   Type II or unspecified type diabetes mellitus without mention of complication, uncontrolled 03/09/2013   Bunion 03/09/2013   Plantar fasciitis, bilateral 03/09/2013   Unspecified vitamin D deficiency 08/31/2012   Type II or unspecified type diabetes mellitus without mention of complication, not stated as uncontrolled 08/31/2012   HYPERLIPIDEMIA 08/05/2010   ANGIOEDEMA 08/05/2010   Discoid lupus 06/12/2010   Obesity 06/12/2010   Diabetes mellitus 10/31/2009    Past Surgical History:  Procedure Laterality Date   BREAST EXCISIONAL BIOPSY Left    small area removed around areola, benign, many years ago   [place]      OB History   No obstetric history on file.      Home Medications    Prior to Admission medications   Medication Sig Start Date End Date Taking? Authorizing Provider  nitrofurantoin, macrocrystal-monohydrate, (MACROBID) 100 MG capsule Take 1 capsule (100 mg total) by mouth 2 (two) times daily for 7 days. 03/03/22 03/10/22 Yes Eliezer Lofts, FNP  atorvastatin (LIPITOR) 40 MG tablet Take 1 tablet (40 mg total) by mouth at bedtime. 02/21/22     Blood Glucose Monitoring Suppl (FIFTY50 GLUCOSE METER 2.0) w/Device KIT Use to check blood sugar as  directed 07/11/17   [provider]  cyclobenzaprine (FLEXERIL) 10 MG tablet Take 1 tablet (10 mg total) by mouth 3 (three) times daily as needed for muscle spasms. 06/10/21   Molpus, John, MD  EPINEPHrine 0.3 mg/0.3 mL IJ SOAJ injection Inject 0.29ms into the thigh muscle as needed for anaphylaxsis. May repeat dose once after 5 - 15 minutes if anaphylatic symptoms do not improve. Seek medical/emergency assitance. 08/17/21     fluticasone (FLONASE) 50 MCG/ACT nasal spray Place 1 spray into both nostrils 2 (two) times daily as needed for allergies or rhinitis. 08/14/17   Bobbitt, RSedalia Muta MD  gabapentin (NEURONTIN) 100 MG capsule Take 100 mg by mouth 3 (three) times daily.    [provider]  glucose blood test strip Use to check blood sugar once daily 09/15/20     influenza vac split quadrivalent PF (FLUARIX QUADRIVALENT) 0.5 ML injection Inject into the muscle. 01/17/22   SCarlyle Basques MD  Lancets (FREESTYLE) lancets Use to check blood sugar once daily    [provider]  meloxicam (MOBIC) 15 MG tablet Take 1 tablet daily as needed for pain. 06/10/21   Molpus, John, MD  mirabegron ER (MYRBETRIQ) 25 MG TB24 tablet TAKE 1 TABLET BY MOUTH ONCE DAILY 03/22/20 03/22/21  OSheldon Silvan, MD  ondansetron (ZOFRAN-ODT) 8 MG disintegrating tablet Take 1 tablet (8 mg total) by mouth every 8 (eight) hours as  needed. 10/03/21   Molpus, John, MD  phenazopyridine (PYRIDIUM) 200 MG tablet Take 1 tablet (200 mg total) by mouth 3 (three) times daily. 06/21/20   Raylene Everts, MD  sitaGLIPtin-metformin (JANUMET) 50-1000 MG tablet Take 1 tablet by mouth 2 (two) times daily with a meal.    [provider]  SYNJARDY XR 12.07-998 MG TB24 Take 1 tablet by mouth daily. 02/21/22     tolterodine (DETROL LA) 4 MG 24 hr capsule Take 1 capsule (4 mg total) by mouth nightly. 04/03/21     triamcinolone cream (KENALOG) 0.1 % Apply to rash sparingly twice daily as needed for up to 14 days.  4/98/26     TRULICITY 1.5 EB/5.8XE SOPN Inject 0.23ms (1.5 mg) as directed every 7 (seven) days. 02/21/22       Family History Family History  Problem Relation Age of Onset   Diabetes Mother    Hypertension Mother    Dementia Mother    Diabetes Father    Hypertension Father    Cancer Father        Colon   Urticaria Father    Rheum arthritis Sister    Colon polyps Sister    Urticaria Brother    Allergic rhinitis Neg Hx    Angioedema Neg Hx    Asthma Neg Hx    Eczema Neg Hx    Immunodeficiency Neg Hx     Social History Social History   Tobacco Use   Smoking status: Never   Smokeless tobacco: Never  Vaping Use   Vaping Use: Never used  Substance Use Topics   Alcohol use: No   Drug use: No     Allergies   Patient has no known allergies.   Review of Systems Review of Systems  Genitourinary:  Positive for dysuria and hematuria.  All other systems reviewed and are negative.    Physical Exam Triage Vital Signs ED Triage Vitals  Enc Vitals Group     BP 03/03/22 1454 121/77     Pulse Rate 03/03/22 1454 81     Resp 03/03/22 1454 16     Temp 03/03/22 1454 98.6 F (37 C)     Temp Source 03/03/22 1454 Oral     SpO2 03/03/22 1454 97 %     Weight 03/03/22 1456 186 lb (84.4 kg)     Height --      Head Circumference --      Peak Flow --      Pain Score 03/03/22 1456 0     Pain Loc --      Pain Edu? --      Excl. in [place] --    No data found.  Updated Vital Signs BP 121/77 (BP Location: Left Arm)   Pulse 81   Temp 98.6 F (37 C) (Oral)   Resp 16   Wt 186 lb (84.4 kg)   SpO2 97%   BMI 32.95 kg/m      Physical Exam Vitals and nursing note reviewed.  Constitutional:      General: She is not in acute distress.    Appearance: Normal appearance. She is obese. She is not ill-appearing.  HENT:     Head: Normocephalic and atraumatic.     Mouth/Throat:     Mouth: Mucous membranes are moist.     Pharynx: Oropharynx is clear.  Eyes:     Extraocular  Movements: Extraocular movements intact.     Conjunctiva/sclera: Conjunctivae normal.     Pupils: Pupils  are equal, round, and reactive to light.  Cardiovascular:     Rate and Rhythm: Normal rate and regular rhythm.     Pulses: Normal pulses.     Heart sounds: Normal heart sounds.  Pulmonary:     Effort: Pulmonary effort is normal.     Breath sounds: Normal breath sounds. No wheezing, rhonchi or rales.  Abdominal:     Tenderness: There is no right CVA tenderness or left CVA tenderness.  Musculoskeletal:        General: Normal range of motion.     Cervical back: Normal range of motion and neck supple.  Skin:    General: Skin is warm and dry.  Neurological:     General: No focal deficit present.     Mental Status: She is alert and oriented to person, place, and time.      UC Treatments / Results  Labs (all labs ordered are listed, but only abnormal results are displayed) Labs Reviewed  POCT URINALYSIS DIP (MANUAL ENTRY) - Abnormal; Notable for the following components:      Result Value   Clarity, UA cloudy (*)    Glucose, UA >=1,000 (*)    Blood, UA large (*)    Protein Ur, POC =30 (*)    Leukocytes, UA Trace (*)    All other components within normal limits    EKG   Radiology No results found.  Procedures Procedures (including critical care time)  Medications Ordered in UC Medications - No data to display  Initial Impression / Assessment and Plan / UC Course  I have reviewed the triage vital signs and the nursing notes.  Pertinent labs & imaging results that were available during my care of the patient were reviewed by me and considered in my medical decision making (see chart for details).     MDM: 1.  Acute cystitis with hematuria-Rx'd Macrobid; 2.  Glucosuria-advised patient to follow-up with PCP for further evaluation of current T2DM control. Advised patient to take medication as directed with food to completion.  Encouraged patient increase daily water  intake to 64 ounces per day while taking this medication.  Advised we will follow-up with a urine culture results once received.  Discharged home, hemodynamically stable. Final Clinical Impressions(s) / UC Diagnoses   Final diagnoses:  Acute cystitis with hematuria  Glucosuria     Discharge Instructions      Advised patient to take medication as directed with food to completion.  Encouraged patient increase daily water intake to 64 ounces per day while taking this medication.  Advised we will follow-up with a urine culture results once received.     ED Prescriptions     Medication Sig Dispense Auth. Provider   nitrofurantoin, macrocrystal-monohydrate, (MACROBID) 100 MG capsule Take 1 capsule (100 mg total) by mouth 2 (two) times daily for 7 days. 14 capsule Eliezer Lofts, FNP      PDMP not reviewed this encounter.   Eliezer Lofts, [place] 03/03/22 [phone]

## 2022-03-04 ENCOUNTER — Telehealth: Payer: Self-pay

## 2022-03-04 NOTE — Telephone Encounter (Signed)
Spoke with patient regarding recent urgent care visit. Pt reported that her symptoms are improving. There were further questions or concerns pertaining to the visit at this time.

## 2022-03-05 LAB — URINE CULTURE: Culture: NO GROWTH

## 2022-04-11 ENCOUNTER — Other Ambulatory Visit (HOSPITAL_BASED_OUTPATIENT_CLINIC_OR_DEPARTMENT_OTHER): Payer: Self-pay

## 2022-04-12 ENCOUNTER — Other Ambulatory Visit (HOSPITAL_BASED_OUTPATIENT_CLINIC_OR_DEPARTMENT_OTHER): Payer: Self-pay

## 2022-04-12 MED ORDER — TOLTERODINE TARTRATE ER 4 MG PO CP24
4.0000 mg | ORAL_CAPSULE | Freq: Every day | ORAL | 0 refills | Status: DC
  Filled 2022-04-12 – 2022-04-16 (×2): qty 90, 90d supply, fill #0

## 2022-04-16 ENCOUNTER — Other Ambulatory Visit (HOSPITAL_BASED_OUTPATIENT_CLINIC_OR_DEPARTMENT_OTHER): Payer: Self-pay

## 2022-06-02 ENCOUNTER — Encounter: Payer: Self-pay | Admitting: Emergency Medicine

## 2022-06-02 ENCOUNTER — Ambulatory Visit
Admission: EM | Admit: 2022-06-02 | Discharge: 2022-06-02 | Disposition: A | Discharge status: Home / Self Care | Payer: Commercial Managed Care - PPO | Attending: Family Medicine | Admitting: Family Medicine

## 2022-06-02 DIAGNOSIS — L0291 Cutaneous abscess, unspecified: Secondary | ICD-10-CM | POA: Diagnosis not present

## 2022-06-02 MED ORDER — IBUPROFEN 800 MG PO TABS: 800.0000 mg | ORAL_TABLET | Freq: Three times a day (TID) | ORAL | 0 refills | Status: DC

## 2022-06-02 MED ORDER — CEPHALEXIN 500 MG PO CAPS: 500.0000 mg | ORAL_CAPSULE | Freq: Three times a day (TID) | ORAL | 0 refills | Status: DC

## 2022-06-02 NOTE — ED Provider Notes (Signed)
[name]    CSN: [id number] Arrival date & time: 06/02/22  1116      History   Chief Complaint Chief Complaint  Patient presents with   Abscess    HPI Theresa Meyers is a 67 y.o. female.   HPI  Patient states she gets a lot of boils She has a red, hot nodule on her left upper back under her bra strap No known cyst beforehand Getting larger  Past Medical History:  Diagnosis Date   Anemia    Colon polyp    Diabetes mellitus    Discoid lupus    Dr. Abner Greenspan   Hypercholesteremia    Vitamin D deficiency     Patient Active Problem List   Diagnosis Date Noted   Urticaria 08/14/2017   Seasonal and perennial allergic rhinitis 08/14/2017   Allergic reaction 08/14/2017   Type II or unspecified type diabetes mellitus without mention of complication, uncontrolled 03/09/2013   Bunion 03/09/2013   Plantar fasciitis, bilateral 03/09/2013   Unspecified vitamin D deficiency 08/31/2012   Type II or unspecified type diabetes mellitus without mention of complication, not stated as uncontrolled 08/31/2012   HYPERLIPIDEMIA 08/05/2010   ANGIOEDEMA 08/05/2010   Discoid lupus 06/12/2010   Obesity 06/12/2010   Diabetes mellitus 10/31/2009    Past Surgical History:  Procedure Laterality Date   BREAST EXCISIONAL BIOPSY Left    small area removed around areola, benign, many years ago   [place]      OB History   No obstetric history on file.      Home Medications    Prior to Admission medications   Medication Sig Start Date End Date Taking? Authorizing Provider  atorvastatin (LIPITOR) 40 MG tablet Take 1 tablet (40 mg total) by mouth at bedtime. 02/21/22  Yes   Blood Glucose Monitoring Suppl (FIFTY50 GLUCOSE METER 2.0) w/Device KIT Use to check blood sugar as directed 07/11/17  Yes [provider]  cephALEXin (KEFLEX) 500 MG capsule Take 1 capsule (500 mg total) by mouth 3 (three) times daily. 06/02/22  Yes Raylene Everts,  MD  EPINEPHrine 0.3 mg/0.3 mL IJ SOAJ injection Inject 0.32ms into the thigh muscle as needed for anaphylaxsis. May repeat dose once after 5 - 15 minutes if anaphylatic symptoms do not improve. Seek medical/emergency assitance. 08/17/21  Yes   glucose blood test strip Use to check blood sugar once daily 09/15/20  Yes   ibuprofen (ADVIL) 800 MG tablet Take 1 tablet (800 mg total) by mouth 3 (three) times daily. 06/02/22  Yes NRaylene Everts MD  Lancets (FREESTYLE) lancets Use to check blood sugar once daily   Yes [provider]  SYNJARDY XR 12.07-998 MG TB24 Take 1 tablet by mouth daily. [id number] Yes   TRULICITY 1.5 [id number] Inject 0.582m (1.5 mg) as directed every 7 (seven) days. 02/21/22  Yes   gabapentin (NEURONTIN) 100 MG capsule Take 100 mg by mouth 3 (three) times daily.    [provider]  mirabegron ER (MYRBETRIQ) 25 MG TB24 tablet TAKE 1 TABLET BY MOUTH ONCE DAILY 03/22/20 03/22/21  O'Sheldon Silvan MD    Family History Family History  Problem Relation Age of Onset   Diabetes Mother    Hypertension Mother    Dementia Mother    Diabetes Father    Hypertension Father    Cancer Father        Colon   Urticaria Father  Rheum arthritis Sister    Colon polyps Sister    Urticaria Brother    Allergic rhinitis Neg Hx    Angioedema Neg Hx    Asthma Neg Hx    Eczema Neg Hx    Immunodeficiency Neg Hx     Social History Social History   Tobacco Use   Smoking status: Never   Smokeless tobacco: Never  Vaping Use   Vaping Use: Never used  Substance Use Topics   Alcohol use: No   Drug use: No     Allergies   Shellfish-derived products   Review of Systems Review of Systems See HPI  Physical Exam Triage Vital Signs ED Triage Vitals  Enc Vitals Group     BP 06/02/22 1131 126/80     Pulse Rate 06/02/22 1131 83     Resp 06/02/22 1131 16     Temp 06/02/22 1131 98.6 F (37 C)     Temp Source 06/02/22 1131 Oral     SpO2 06/02/22 1131 96 %      Weight 06/02/22 1133 185 lb (83.9 kg)     Height 06/02/22 1133 '5\' 2"'$  (1.575 m)     Head Circumference --      Peak Flow --      Pain Score 06/02/22 1133 2     Pain Loc --      Pain Edu? --      Excl. in [place]? --    No data found.  Updated Vital Signs BP 126/80 (BP Location: Right Arm)   Pulse 83   Temp 98.6 F (37 C) (Oral)   Resp 16   Ht '5\' 2"'$  (1.575 m)   Wt 83.9 kg   SpO2 96%   BMI 33.84 kg/m      Physical Exam Vitals reviewed.  Constitutional:      General: She is not in acute distress.    Appearance: She is well-developed and normal weight.  HENT:     Head: Normocephalic and atraumatic.  Eyes:     Conjunctiva/sclera: Conjunctivae normal.     Pupils: Pupils are equal, round, and reactive to light.  Neck:      Comments: Deep erythema, induration, tender, warm area measures 4 cm across.  Cardiovascular:     Rate and Rhythm: Normal rate.  Pulmonary:     Effort: Pulmonary effort is normal. No respiratory distress.  Abdominal:     General: There is no distension.     Palpations: Abdomen is soft.  Musculoskeletal:        General: Normal range of motion.     Cervical back: Normal range of motion.  Skin:    General: Skin is warm and dry.     Findings: Lesion present.  Neurological:     Mental Status: She is alert.      UC Treatments / Results  Labs (all labs ordered are listed, but only abnormal results are displayed) Labs Reviewed - No data to display  EKG   Radiology No results found.  Procedures With patient's permission the area was cleansed with Betadine.  Anesthetized with a cc of 1% lidocaine.  An 18-gauge needle was then and inserted into the center of the lesion.  No purulence was obtained with aspiration   Medications Ordered in UC Medications - No data to display  Initial Impression / Assessment and Plan / UC Course  I have reviewed the triage vital signs and the nursing notes.  Pertinent labs & imaging results that were  available  during my care of the patient were reviewed by me and considered in my medical decision making (see chart for details).     Abscess is likely forming, unable to do I&D without aspiration of purulence.  Will treat with antibiotics warm compresses. Final Clinical Impressions(s) / UC Diagnoses   Final diagnoses:  Abscess     Discharge Instructions      Take the Keflex 3 times a day.  Take 3 doses today Take ibuprofen with food as needed for pain.  May take 3 times a day Use warm moist compresses 2 or 3 times a day Return if needed   ED Prescriptions     Medication Sig Dispense Auth. Provider   cephALEXin (KEFLEX) 500 MG capsule Take 1 capsule (500 mg total) by mouth 3 (three) times daily. 21 capsule Raylene Everts, MD   ibuprofen (ADVIL) 800 MG tablet Take 1 tablet (800 mg total) by mouth 3 (three) times daily. 21 tablet Raylene Everts, MD      PDMP not reviewed this encounter.   Raylene Everts, MD 06/02/22 [phone]

## 2022-06-02 NOTE — Discharge Instructions (Signed)
Take the Keflex 3 times a day.  Take 3 doses today Take ibuprofen with food as needed for pain.  May take 3 times a day Use warm moist compresses 2 or 3 times a day Return if needed

## 2022-06-02 NOTE — ED Triage Notes (Signed)
Patient c/o a knot on left shoulder x 1 week.  The area is red, tender to touch and has increased in size.  Patient denies any OTC pain meds.

## 2022-06-03 ENCOUNTER — Telehealth: Payer: Self-pay | Admitting: Emergency Medicine

## 2022-06-03 NOTE — Telephone Encounter (Signed)
Call by this RN to see how Theresa Meyers was today & to see if she had any questions or concerns- left a voice mail / a call back #

## 2022-06-05 DIAGNOSIS — H1132 Conjunctival hemorrhage, left eye: Secondary | ICD-10-CM | POA: Diagnosis not present

## 2022-06-14 ENCOUNTER — Encounter (HOSPITAL_BASED_OUTPATIENT_CLINIC_OR_DEPARTMENT_OTHER): Payer: Self-pay | Admitting: Speech Pathology

## 2022-07-13 ENCOUNTER — Other Ambulatory Visit: Payer: Self-pay | Admitting: Family Medicine

## 2022-07-13 DIAGNOSIS — Z1231 Encounter for screening mammogram for malignant neoplasm of breast: Secondary | ICD-10-CM

## 2022-07-17 ENCOUNTER — Other Ambulatory Visit (HOSPITAL_BASED_OUTPATIENT_CLINIC_OR_DEPARTMENT_OTHER): Payer: Self-pay

## 2022-07-18 ENCOUNTER — Other Ambulatory Visit (HOSPITAL_BASED_OUTPATIENT_CLINIC_OR_DEPARTMENT_OTHER): Payer: Self-pay

## 2022-07-20 ENCOUNTER — Other Ambulatory Visit (HOSPITAL_BASED_OUTPATIENT_CLINIC_OR_DEPARTMENT_OTHER): Payer: Self-pay

## 2022-07-26 ENCOUNTER — Other Ambulatory Visit (HOSPITAL_BASED_OUTPATIENT_CLINIC_OR_DEPARTMENT_OTHER): Payer: Self-pay

## 2022-07-27 ENCOUNTER — Other Ambulatory Visit (HOSPITAL_COMMUNITY): Payer: Self-pay

## 2022-07-27 ENCOUNTER — Other Ambulatory Visit (HOSPITAL_BASED_OUTPATIENT_CLINIC_OR_DEPARTMENT_OTHER): Payer: Self-pay

## 2022-07-27 MED ORDER — FREESTYLE TEST VI STRP
ORAL_STRIP | 0 refills | Status: DC
  Filled 2022-07-27: qty 50, 50d supply, fill #0

## 2022-07-27 MED ORDER — ASSURE COMFORT LANCETS 28G MISC: 0 refills | Status: AC

## 2022-07-31 ENCOUNTER — Encounter (HOSPITAL_BASED_OUTPATIENT_CLINIC_OR_DEPARTMENT_OTHER): Payer: Self-pay

## 2022-07-31 ENCOUNTER — Ambulatory Visit (HOSPITAL_BASED_OUTPATIENT_CLINIC_OR_DEPARTMENT_OTHER)
Admission: RE | Admit: 2022-07-31 | Discharge: 2022-07-31 | Disposition: A | Discharge status: Home / Self Care | Payer: Commercial Managed Care - PPO | Source: Ambulatory Visit | Attending: Family Medicine | Admitting: Family Medicine

## 2022-07-31 DIAGNOSIS — Z1231 Encounter for screening mammogram for malignant neoplasm of breast: Secondary | ICD-10-CM | POA: Insufficient documentation

## 2022-08-02 ENCOUNTER — Other Ambulatory Visit (HOSPITAL_BASED_OUTPATIENT_CLINIC_OR_DEPARTMENT_OTHER): Payer: Self-pay

## 2022-08-02 MED ORDER — TOLTERODINE TARTRATE ER 4 MG PO CP24
4.0000 mg | ORAL_CAPSULE | Freq: Every day | ORAL | 0 refills | Status: DC
  Filled 2022-08-02: qty 90, 90d supply, fill #0

## 2022-08-15 ENCOUNTER — Other Ambulatory Visit (HOSPITAL_BASED_OUTPATIENT_CLINIC_OR_DEPARTMENT_OTHER): Payer: Self-pay

## 2022-08-15 MED ORDER — OMRON 3 SERIES BP MONITOR DEVI
0 refills | Status: AC
  Filled 2022-08-15: qty 1, 30d supply, fill #0

## 2022-09-07 ENCOUNTER — Other Ambulatory Visit (HOSPITAL_BASED_OUTPATIENT_CLINIC_OR_DEPARTMENT_OTHER): Payer: Self-pay

## 2022-09-26 ENCOUNTER — Other Ambulatory Visit (HOSPITAL_BASED_OUTPATIENT_CLINIC_OR_DEPARTMENT_OTHER): Payer: Self-pay

## 2022-09-26 DIAGNOSIS — Z01419 Encounter for gynecological examination (general) (routine) without abnormal findings: Secondary | ICD-10-CM | POA: Diagnosis not present

## 2022-09-26 MED ORDER — SOLIFENACIN SUCCINATE 10 MG PO TABS
10.0000 mg | ORAL_TABLET | Freq: Every day | ORAL | 3 refills | Status: DC
  Filled 2022-09-26: qty 90, 90d supply, fill #0
  Filled 2023-02-01: qty 90, 90d supply, fill #1

## 2022-10-08 ENCOUNTER — Other Ambulatory Visit (HOSPITAL_BASED_OUTPATIENT_CLINIC_OR_DEPARTMENT_OTHER): Payer: Self-pay

## 2022-10-12 ENCOUNTER — Other Ambulatory Visit (HOSPITAL_BASED_OUTPATIENT_CLINIC_OR_DEPARTMENT_OTHER): Payer: Self-pay

## 2022-10-12 ENCOUNTER — Encounter (HOSPITAL_BASED_OUTPATIENT_CLINIC_OR_DEPARTMENT_OTHER): Payer: Self-pay

## 2022-10-12 ENCOUNTER — Other Ambulatory Visit: Payer: Self-pay

## 2022-10-12 MED ORDER — TRULICITY 1.5 MG/0.5ML ~~LOC~~ SOAJ
1.5000 mg | SUBCUTANEOUS | 0 refills | Status: DC
  Filled 2022-10-12: qty 2, 28d supply, fill #0

## 2022-10-12 MED ORDER — ATORVASTATIN CALCIUM 40 MG PO TABS
40.0000 mg | ORAL_TABLET | Freq: Every evening | ORAL | 0 refills | Status: DC
  Filled 2022-10-12: qty 30, 30d supply, fill #0

## 2022-10-12 MED ORDER — SYNJARDY XR 12.5-1000 MG PO TB24
1.0000 | ORAL_TABLET | Freq: Every day | ORAL | 0 refills | Status: DC
  Filled 2022-10-12: qty 30, 30d supply, fill #0

## 2022-10-24 DIAGNOSIS — E782 Mixed hyperlipidemia: Secondary | ICD-10-CM | POA: Diagnosis not present

## 2022-10-24 DIAGNOSIS — E1165 Type 2 diabetes mellitus with hyperglycemia: Secondary | ICD-10-CM | POA: Diagnosis not present

## 2022-10-24 DIAGNOSIS — Z09 Encounter for follow-up examination after completed treatment for conditions other than malignant neoplasm: Secondary | ICD-10-CM | POA: Diagnosis not present

## 2022-10-26 ENCOUNTER — Other Ambulatory Visit: Payer: Self-pay | Admitting: Oncology

## 2022-10-26 DIAGNOSIS — Z006 Encounter for examination for normal comparison and control in clinical research program: Secondary | ICD-10-CM

## 2022-10-30 ENCOUNTER — Other Ambulatory Visit (HOSPITAL_BASED_OUTPATIENT_CLINIC_OR_DEPARTMENT_OTHER): Payer: Self-pay

## 2022-10-30 DIAGNOSIS — E1165 Type 2 diabetes mellitus with hyperglycemia: Secondary | ICD-10-CM | POA: Diagnosis not present

## 2022-10-30 DIAGNOSIS — Z7984 Long term (current) use of oral hypoglycemic drugs: Secondary | ICD-10-CM | POA: Diagnosis not present

## 2022-10-30 DIAGNOSIS — E785 Hyperlipidemia, unspecified: Secondary | ICD-10-CM | POA: Diagnosis not present

## 2022-10-30 DIAGNOSIS — Z79899 Other long term (current) drug therapy: Secondary | ICD-10-CM | POA: Diagnosis not present

## 2022-10-30 DIAGNOSIS — Z7985 Long-term (current) use of injectable non-insulin antidiabetic drugs: Secondary | ICD-10-CM | POA: Diagnosis not present

## 2022-10-30 DIAGNOSIS — Z91141 Patient's other noncompliance with medication regimen due to financial hardship: Secondary | ICD-10-CM | POA: Diagnosis not present

## 2022-10-30 MED ORDER — MOUNJARO 5 MG/0.5ML ~~LOC~~ SOAJ
5.0000 mg | SUBCUTANEOUS | 0 refills | Status: DC
  Filled 2022-10-30 – 2022-11-16 (×2): qty 2, 28d supply, fill #0

## 2022-10-30 MED ORDER — ATORVASTATIN CALCIUM 20 MG PO TABS
20.0000 mg | ORAL_TABLET | Freq: Every day | ORAL | 0 refills | Status: DC
  Filled 2022-10-30 – 2022-11-16 (×2): qty 90, 90d supply, fill #0

## 2022-10-30 MED ORDER — SYNJARDY XR 12.5-1000 MG PO TB24
2.0000 | ORAL_TABLET | Freq: Every day | ORAL | 0 refills | Status: DC
  Filled 2022-10-30: qty 180, 90d supply, fill #0
  Filled 2022-11-05 – 2022-11-16 (×2): qty 60, 30d supply, fill #0
  Filled 2023-02-01: qty 60, 30d supply, fill #1
  Filled 2023-04-25: qty 60, 30d supply, fill #2

## 2022-11-05 ENCOUNTER — Other Ambulatory Visit (HOSPITAL_BASED_OUTPATIENT_CLINIC_OR_DEPARTMENT_OTHER): Payer: Self-pay

## 2022-11-15 ENCOUNTER — Other Ambulatory Visit (HOSPITAL_BASED_OUTPATIENT_CLINIC_OR_DEPARTMENT_OTHER): Payer: Self-pay

## 2022-11-16 ENCOUNTER — Other Ambulatory Visit (HOSPITAL_BASED_OUTPATIENT_CLINIC_OR_DEPARTMENT_OTHER): Payer: Self-pay

## 2022-11-19 ENCOUNTER — Other Ambulatory Visit (HOSPITAL_BASED_OUTPATIENT_CLINIC_OR_DEPARTMENT_OTHER): Payer: Self-pay

## 2022-12-21 ENCOUNTER — Other Ambulatory Visit (HOSPITAL_BASED_OUTPATIENT_CLINIC_OR_DEPARTMENT_OTHER): Payer: Self-pay

## 2022-12-21 MED ORDER — MOUNJARO 5 MG/0.5ML ~~LOC~~ SOAJ
5.0000 mg | SUBCUTANEOUS | 0 refills | Status: DC
  Filled 2022-12-21: qty 2, 28d supply, fill #0

## 2022-12-28 ENCOUNTER — Other Ambulatory Visit (HOSPITAL_BASED_OUTPATIENT_CLINIC_OR_DEPARTMENT_OTHER): Payer: Self-pay

## 2023-01-10 ENCOUNTER — Other Ambulatory Visit (HOSPITAL_BASED_OUTPATIENT_CLINIC_OR_DEPARTMENT_OTHER): Payer: Self-pay

## 2023-01-10 MED ORDER — FLUAD 0.5 ML IM SUSY
0.5000 mL | PREFILLED_SYRINGE | Freq: Once | INTRAMUSCULAR | 0 refills | Status: AC
  Filled 2023-01-10: qty 0.5, 1d supply, fill #0

## 2023-02-01 ENCOUNTER — Other Ambulatory Visit: Payer: Self-pay

## 2023-02-01 ENCOUNTER — Other Ambulatory Visit (HOSPITAL_BASED_OUTPATIENT_CLINIC_OR_DEPARTMENT_OTHER): Payer: Self-pay

## 2023-02-06 ENCOUNTER — Encounter (HOSPITAL_BASED_OUTPATIENT_CLINIC_OR_DEPARTMENT_OTHER): Payer: Self-pay

## 2023-02-06 ENCOUNTER — Other Ambulatory Visit (HOSPITAL_BASED_OUTPATIENT_CLINIC_OR_DEPARTMENT_OTHER): Payer: Self-pay

## 2023-02-06 ENCOUNTER — Other Ambulatory Visit: Payer: Self-pay

## 2023-02-06 MED ORDER — SYNJARDY XR 12.5-1000 MG PO TB24
2.0000 | ORAL_TABLET | Freq: Every day | ORAL | 0 refills | Status: DC
  Filled 2023-02-06: qty 60, 30d supply, fill #0

## 2023-02-06 MED ORDER — ATORVASTATIN CALCIUM 20 MG PO TABS
20.0000 mg | ORAL_TABLET | Freq: Every evening | ORAL | 0 refills | Status: DC
  Filled 2023-02-20: qty 30, 30d supply, fill #0

## 2023-02-06 MED ORDER — MOUNJARO 5 MG/0.5ML ~~LOC~~ SOAJ
5.0000 mg | SUBCUTANEOUS | 0 refills | Status: DC
  Filled 2023-02-06: qty 2, 28d supply, fill #0

## 2023-02-08 ENCOUNTER — Other Ambulatory Visit (HOSPITAL_BASED_OUTPATIENT_CLINIC_OR_DEPARTMENT_OTHER): Payer: Self-pay

## 2023-02-20 ENCOUNTER — Other Ambulatory Visit (HOSPITAL_BASED_OUTPATIENT_CLINIC_OR_DEPARTMENT_OTHER): Payer: Self-pay

## 2023-03-01 ENCOUNTER — Other Ambulatory Visit (HOSPITAL_BASED_OUTPATIENT_CLINIC_OR_DEPARTMENT_OTHER): Payer: Self-pay

## 2023-03-06 DIAGNOSIS — E119 Type 2 diabetes mellitus without complications: Secondary | ICD-10-CM | POA: Diagnosis not present

## 2023-03-06 DIAGNOSIS — Z961 Presence of intraocular lens: Secondary | ICD-10-CM | POA: Diagnosis not present

## 2023-04-09 ENCOUNTER — Other Ambulatory Visit (HOSPITAL_COMMUNITY): Payer: Self-pay

## 2023-04-11 ENCOUNTER — Ambulatory Visit
Admission: EM | Admit: 2023-04-11 | Discharge: 2023-04-11 | Disposition: A | Discharge status: Home / Self Care | Payer: Commercial Managed Care - PPO | Attending: Family Medicine | Admitting: Family Medicine

## 2023-04-11 ENCOUNTER — Telehealth: Payer: Self-pay

## 2023-04-11 ENCOUNTER — Encounter: Payer: Self-pay | Admitting: Emergency Medicine

## 2023-04-11 DIAGNOSIS — L03319 Cellulitis of trunk, unspecified: Secondary | ICD-10-CM

## 2023-04-11 DIAGNOSIS — L02219 Cutaneous abscess of trunk, unspecified: Secondary | ICD-10-CM | POA: Diagnosis not present

## 2023-04-11 MED ORDER — IBUPROFEN 800 MG PO TABS: 800.0000 mg | ORAL_TABLET | Freq: Three times a day (TID) | ORAL | 0 refills | Status: DC

## 2023-04-11 MED ORDER — AMOXICILLIN-POT CLAVULANATE 875-125 MG PO TABS: 1.0000 | ORAL_TABLET | Freq: Two times a day (BID) | ORAL | 0 refills | Status: DC

## 2023-04-11 MED ORDER — TRAMADOL HCL 50 MG PO TABS
50.0000 mg | ORAL_TABLET | Freq: Four times a day (QID) | ORAL | 0 refills | Status: DC | PRN
Start: 2023-04-11 — End: 2023-04-22

## 2023-04-11 MED ORDER — CEFTRIAXONE SODIUM 1 G IJ SOLR
1000.0000 mg | Freq: Once | INTRAMUSCULAR | Status: AC
  Administered 2023-04-11: 1000 mg via INTRAMUSCULAR

## 2023-04-11 NOTE — Discharge Instructions (Signed)
 Warm compresses or warm sitz bath 2 times a day Take the Augmentin  antibiotic 2 times a day.  You may start this tomorrow Take ibuprofen  for moderate pain or when working Take tramadol  if needed for severe pain, do not drive on tramadol  Return if needed

## 2023-04-11 NOTE — ED Triage Notes (Signed)
 Patient states that she has an abscess near her vagina x 4 days.  The area is becoming more painful and tender.  Denies any drainage.  No OTC pain meds.

## 2023-04-11 NOTE — ED Provider Notes (Signed)
 [facility]    CSN: [id number] Arrival date & time: 04/11/23  1905      History   Chief Complaint Chief Complaint  Patient presents with   Abscess    HPI Theresa Meyers is a 68 y.o. female.   Theresa Meyers is known from prior visits.  She has frequent abscess formation.  She is here for an abscess that is near my bottom.  Large and painful.  No fever.  No trouble with diabetes.  No high sugars.     Past Medical History:  Diagnosis Date   Anemia    Colon polyp    Diabetes mellitus    Discoid lupus    Dr. Selma   Hypercholesteremia    Vitamin D  deficiency     Patient Active Problem List   Diagnosis Date Noted   Urticaria 08/14/2017   Seasonal and perennial allergic rhinitis 08/14/2017   Allergic reaction 08/14/2017   Type II or unspecified type diabetes mellitus without mention of complication, uncontrolled 03/09/2013   Bunion 03/09/2013   Plantar fasciitis, bilateral 03/09/2013   Vitamin D  deficiency 08/31/2012   Type II or unspecified type diabetes mellitus without mention of complication, not stated as uncontrolled 08/31/2012   HYPERLIPIDEMIA 08/05/2010   Angioedema 08/05/2010   Discoid lupus 06/12/2010   Obesity 06/12/2010   Diabetes mellitus (HCC) 10/31/2009    Past Surgical History:  Procedure Laterality Date   BREAST EXCISIONAL BIOPSY Left    small area removed around areola, benign, many years ago   CARPAL TUNNEL RELEASE     FOOT SURGERY      OB History   No obstetric history on file.      Home Medications    Prior to Admission medications   Medication Sig Start Date End Date Taking? Authorizing Provider  Assure Comfort Lancets 28G MISC 1 Stick by miscellaneous route daily. 07/27/22  Yes   atorvastatin  (LIPITOR) 20 MG tablet Take 1 tablet (20 mg total) by mouth nightly. MUST HAVE OFFICE VISIT AND LAB FOR FURTHER REFILLS 02/06/23  Yes   Blood Glucose Monitoring Suppl (FIFTY50 GLUCOSE METER 2.0) w/Device KIT Use to check blood sugar as  directed 07/11/17  Yes [provider]  Blood Pressure Monitoring (OMRON 3 SERIES BP MONITOR) DEVI Use as directed 08/15/22  Yes Delores Morene JAYSON, RPH  glucose blood (FREESTYLE TEST STRIPS) test strip USE TO CHECK BLOOD SUGAR DAILY 07/27/22  Yes   glucose blood test strip Use to check blood sugar once daily 09/15/20  Yes   ibuprofen  (ADVIL ) 800 MG tablet Take 1 tablet (800 mg total) by mouth 3 (three) times daily. 04/11/23  Yes Maranda Jamee Jacob, MD  Lancets (FREESTYLE) lancets Use to check blood sugar once daily   Yes [provider]  SYNJARDY  XR 12.07-998 MG TB24 Take 2 tablets by mouth daily. 10/30/22  Yes   SYNJARDY  XR 12.07-998 MG TB24 Take 2 tablets by mouth daily. MUST HAVE OFFICE VISIT AND LAB FOR FURTHER REFILLS 02/06/23  Yes   tirzepatide  (MOUNJARO ) 5 MG/0.5ML Pen Inject 5 mg under the skin once a week. MUST HAVE OFFICE VISIT AND LAB FOR FURTHER REFILLS 02/06/23  Yes   traMADol  (ULTRAM ) 50 MG tablet Take 1 tablet (50 mg total) by mouth every 6 (six) hours as needed. 04/11/23  Yes Maranda Jamee Jacob, MD  amoxicillin -clavulanate (AUGMENTIN ) 875-125 MG tablet Take 1 tablet by mouth every 12 (twelve) hours. 04/11/23   Maranda Jamee Jacob, MD  EPINEPHrine  0.3 mg/0.3 mL IJ SOAJ  injection Inject 0.3mls into the thigh muscle as needed for anaphylaxsis. May repeat dose once after 5 - 15 minutes if anaphylatic symptoms do not improve. Seek medical/emergency assitance. 08/17/21     gabapentin (NEURONTIN) 100 MG capsule Take 100 mg by mouth 3 (three) times daily.    [provider]  mirabegron ER (MYRBETRIQ) 25 MG TB24 tablet TAKE 1 TABLET BY MOUTH ONCE DAILY 03/22/20 03/22/21  Carlene Ozell BRAVO., MD    Family History Family History  Problem Relation Age of Onset   Diabetes Mother    Hypertension Mother    Dementia Mother    Diabetes Father    Hypertension Father    Cancer Father        Colon   Urticaria Father    Rheum arthritis Sister    Colon polyps Sister    Urticaria  Brother    Allergic rhinitis Neg Hx    Angioedema Neg Hx    Asthma Neg Hx    Eczema Neg Hx    Immunodeficiency Neg Hx     Social History Social History   Tobacco Use   Smoking status: Never   Smokeless tobacco: Never  Vaping Use   Vaping status: Never Used  Substance Use Topics   Alcohol use: No   Drug use: No     Allergies   Shellfish-derived products   Review of Systems Review of Systems See HPI  Physical Exam Triage Vital Signs ED Triage Vitals  Encounter Vitals Group     BP 04/11/23 1914 128/83     Systolic BP Percentile --      Diastolic BP Percentile --      Pulse Rate 04/11/23 1914 (!) 107     Resp 04/11/23 1914 16     Temp 04/11/23 1914 99.1 F (37.3 C)     Temp Source 04/11/23 1914 Oral     SpO2 04/11/23 1914 96 %     Weight 04/11/23 1916 180 lb (81.6 kg)     Height 04/11/23 1916 5' 2.5 (1.588 m)     Head Circumference --      Peak Flow --      Pain Score 04/11/23 1916 5     Pain Loc --      Pain Education --      Exclude from Growth Chart --    No data found.  Updated Vital Signs BP 128/83 (BP Location: Left Arm)   Pulse (!) 107   Temp 99.1 F (37.3 C) (Oral)   Resp 16   Ht 5' 2.5 (1.588 m)   Wt 81.6 kg   SpO2 96%   BMI 32.40 kg/m       Physical Exam Constitutional:      General: She is not in acute distress.    Appearance: She is well-developed. She is not ill-appearing.  HENT:     Head: Normocephalic and atraumatic.  Eyes:     Conjunctiva/sclera: Conjunctivae normal.     Pupils: Pupils are equal, round, and reactive to light.  Cardiovascular:     Rate and Rhythm: Normal rate.  Pulmonary:     Effort: Pulmonary effort is normal. No respiratory distress.  Abdominal:     General: There is no distension.     Palpations: Abdomen is soft.  Genitourinary:   Musculoskeletal:        General: Normal range of motion.     Cervical back: Normal range of motion.  Skin:    General: Skin is warm and  dry.  Neurological:      Mental Status: She is alert.      UC Treatments / Results  Labs (all labs ordered are listed, but only abnormal results are displayed) Labs Reviewed - No data to display  EKG   Radiology No results found.  Procedures Procedures (including critical care time)  Medications Ordered in UC Medications  cefTRIAXone  (ROCEPHIN ) injection 1,000 mg (1,000 mg Intramuscular Given 04/11/23 1942)    Initial Impression / Assessment and Plan / UC Course  I have reviewed the triage vital signs and the nursing notes.  Pertinent labs & imaging results that were available during my care of the patient were reviewed by me and considered in my medical decision making (see chart for details).     I discussed at I and D.  Patient declines. Final Clinical Impressions(s) / UC Diagnoses   Final diagnoses:  Cellulitis and abscess of trunk     Discharge Instructions      Warm compresses or warm sitz bath 2 times a day Take the Augmentin  antibiotic 2 times a day.  You may start this tomorrow Take ibuprofen  for moderate pain or when working Take tramadol  if needed for severe pain, do not drive on tramadol  Return if needed   ED Prescriptions     Medication Sig Dispense Auth. Provider   amoxicillin -clavulanate (AUGMENTIN ) 875-125 MG tablet Take 1 tablet by mouth every 12 (twelve) hours. 14 tablet Maranda Jamee Jacob, MD   ibuprofen  (ADVIL ) 800 MG tablet Take 1 tablet (800 mg total) by mouth 3 (three) times daily. 21 tablet Maranda Jamee Jacob, MD   traMADol  (ULTRAM ) 50 MG tablet Take 1 tablet (50 mg total) by mouth every 6 (six) hours as needed. 15 tablet Maranda Jamee Jacob, MD      I have reviewed the PDMP during this encounter.   Maranda Jamee Jacob, MD 04/11/23 2013

## 2023-04-15 ENCOUNTER — Encounter: Payer: Self-pay | Admitting: Emergency Medicine

## 2023-04-15 ENCOUNTER — Ambulatory Visit
Admission: EM | Admit: 2023-04-15 | Discharge: 2023-04-15 | Disposition: A | Discharge status: Home / Self Care | Payer: Commercial Managed Care - PPO | Attending: Family Medicine | Admitting: Family Medicine

## 2023-04-15 DIAGNOSIS — L0231 Cutaneous abscess of buttock: Secondary | ICD-10-CM

## 2023-04-15 MED ORDER — SACCHAROMYCES BOULARDII 250 MG PO CAPS: 250.0000 mg | ORAL_CAPSULE | Freq: Two times a day (BID) | ORAL | 0 refills | Status: DC

## 2023-04-15 MED ORDER — SULFAMETHOXAZOLE-TRIMETHOPRIM 800-160 MG PO TABS: 1.0000 | ORAL_TABLET | Freq: Two times a day (BID) | ORAL | 0 refills | Status: AC

## 2023-04-15 NOTE — ED Triage Notes (Signed)
 Patient was seen on 04/11/2023 for a vaginal abscess.  Patient did start her antibiotic but the abscess has increased in size and very painful.  Patient has done warm water and epison bath soaks, applied heat to the area, no relief.

## 2023-04-15 NOTE — Discharge Instructions (Signed)
 Take the Augmentin  2 x a day until gone Add bactrim  DS 2 x a day as well \\take  with food Take the florastor probiotic as directed to prevent diarrhea May also take imodium for diarrhea if needed Continue warm soak 2 x a day See me for problems The gauze in the opening should fall out in 1-2 days

## 2023-04-22 ENCOUNTER — Other Ambulatory Visit (HOSPITAL_BASED_OUTPATIENT_CLINIC_OR_DEPARTMENT_OTHER): Payer: Self-pay

## 2023-04-22 ENCOUNTER — Encounter: Payer: Self-pay | Admitting: Family Medicine

## 2023-04-22 ENCOUNTER — Ambulatory Visit: Payer: Commercial Managed Care - PPO | Admitting: Family Medicine

## 2023-04-22 VITALS — BP 116/71 | HR 72 | Temp 98.4°F | Resp 18 | Ht 62.0 in | Wt 175.7 lb

## 2023-04-22 DIAGNOSIS — Z91013 Allergy to seafood: Secondary | ICD-10-CM

## 2023-04-22 DIAGNOSIS — R159 Full incontinence of feces: Secondary | ICD-10-CM

## 2023-04-22 DIAGNOSIS — Z7689 Persons encountering health services in other specified circumstances: Secondary | ICD-10-CM | POA: Diagnosis not present

## 2023-04-22 DIAGNOSIS — Z7985 Long-term (current) use of injectable non-insulin antidiabetic drugs: Secondary | ICD-10-CM

## 2023-04-22 DIAGNOSIS — R1314 Dysphagia, pharyngoesophageal phase: Secondary | ICD-10-CM

## 2023-04-22 DIAGNOSIS — E782 Mixed hyperlipidemia: Secondary | ICD-10-CM

## 2023-04-22 DIAGNOSIS — E119 Type 2 diabetes mellitus without complications: Secondary | ICD-10-CM | POA: Diagnosis not present

## 2023-04-22 MED ORDER — ATORVASTATIN CALCIUM 20 MG PO TABS
20.0000 mg | ORAL_TABLET | Freq: Every evening | ORAL | 1 refills | Status: DC
  Filled 2023-04-22: qty 90, 90d supply, fill #0
  Filled 2023-08-09: qty 90, 90d supply, fill #1

## 2023-04-22 MED ORDER — FIFTY50 GLUCOSE METER 2.0 W/DEVICE KIT
PACK | 0 refills | Status: AC
  Filled 2023-04-22: qty 1, 30d supply, fill #0

## 2023-04-22 MED ORDER — FREESTYLE TEST VI STRP
ORAL_STRIP | 5 refills | Status: AC
  Filled 2023-04-22: qty 100, 90d supply, fill #0

## 2023-04-22 MED ORDER — FREESTYLE LANCETS MISC
1.0000 | Freq: Every day | 5 refills | Status: AC
  Filled 2023-04-22: qty 100, 90d supply, fill #0

## 2023-04-22 MED ORDER — EPINEPHRINE 0.3 MG/0.3ML IJ SOAJ
INTRAMUSCULAR | 1 refills | Status: AC
  Filled 2023-04-22: qty 2, 2d supply, fill #0

## 2023-04-22 NOTE — Progress Notes (Signed)
New Patient Office Visit  Subjective    Patient ID: Theresa Meyers, female    DOB: [birth year]  Age: 68 y.o. MRN: [id number]  CC:  Chief Complaint  Patient presents with   Establish Care    Patient is here to establish care with a new PCP,     HPI Theresa Meyers presents to establish care. Pt is new to me.  Bowel incontinence She reports concerns with bowel movements. She reports sometimes she wipes and has bowel on her tissue. She reports this has been present in the last few months. She reports being on antibiotics a few weeks ago. She reports now she's had diarrhea since antibiotics but this issue has been present for months. Her last colonoscopy was 2 years ago. She also has urinary incontinence. She is taking Vesicare 10mg  for urinary symptoms.   Dysphagia She also reports when she eats, she has pain in her midsternal region. It feels like food gets stuck. She reports this has been present for the last year. She reports it doesn't happen all the time. She is unsure of triggers.   Shellfish allergy She also needs new Epipen.   Diabetes She also needs diabetic testing supplies and meter. She is taking Mounjaro 5 mg monthly due to not wanting anymore weight loss. She is also taking Synjardy 12.5/1000mg  daily. She needs refills on her Atorvastatin 20mg  for HLD.   Outpatient Encounter Medications as of 04/22/2023  Medication Sig   Assure Comfort Lancets 28G MISC 1 Stick by miscellaneous route daily.   atorvastatin (LIPITOR) 20 MG tablet Take 1 tablet (20 mg total) by mouth nightly. MUST HAVE OFFICE VISIT AND LAB FOR FURTHER REFILLS   Blood Glucose Monitoring Suppl (FIFTY50 GLUCOSE METER 2.0) w/Device KIT Use to check blood sugar as directed   Blood Pressure Monitoring (OMRON 3 SERIES BP MONITOR) DEVI Use as directed   glucose blood (FREESTYLE TEST STRIPS) test strip USE TO CHECK BLOOD SUGAR DAILY   glucose blood test strip Use to check blood sugar once daily   Lancets (FREESTYLE)  lancets Use to check blood sugar once daily   saccharomyces boulardii (FLORASTOR) 250 MG capsule Take 1 capsule (250 mg total) by mouth 2 (two) times daily.   sulfamethoxazole-trimethoprim (BACTRIM DS) 800-160 MG tablet Take 1 tablet by mouth 2 (two) times daily for 7 days.   SYNJARDY XR 12.07-998 MG TB24 Take 2 tablets by mouth daily.   tirzepatide [facility]) 5 MG/0.5ML Pen Inject 5 mg under the skin once a week. MUST HAVE OFFICE VISIT AND LAB FOR FURTHER REFILLS   amoxicillin-clavulanate (AUGMENTIN) 875-125 MG tablet Take 1 tablet by mouth every 12 (twelve) hours. (Patient not taking: Reported on 04/22/2023)   EPINEPHrine 0.3 mg/0.3 mL IJ SOAJ injection Inject 0.65mls into the thigh muscle as needed for anaphylaxsis. May repeat dose once after 5 - 15 minutes if anaphylatic symptoms do not improve. Seek medical/emergency assitance. (Patient not taking: Reported on 04/22/2023)   gabapentin (NEURONTIN) 100 MG capsule Take 100 mg by mouth 3 (three) times daily. (Patient not taking: Reported on 04/22/2023)   ibuprofen (ADVIL) 800 MG tablet Take 1 tablet (800 mg total) by mouth 3 (three) times daily. (Patient not taking: Reported on 04/22/2023)   mirabegron ER (MYRBETRIQ) 25 MG TB24 tablet TAKE 1 TABLET BY MOUTH ONCE DAILY   traMADol (ULTRAM) 50 MG tablet Take 1 tablet (50 mg total) by mouth every 6 (six) hours as needed. (Patient not taking: Reported on 04/22/2023)   [DISCONTINUED]  SYNJARDY XR 12.07-998 MG TB24 Take 2 tablets by mouth daily. MUST HAVE OFFICE VISIT AND LAB FOR FURTHER REFILLS   No facility-administered encounter medications on file as of 04/22/2023.    Past Medical History:  Diagnosis Date   Anemia    Colon polyp    Diabetes mellitus    Discoid lupus    Dr. Cardell Peach   Hypercholesteremia    Incontinence    Incontinence of bowel    Vitamin D deficiency     Past Surgical History:  Procedure Laterality Date   BREAST EXCISIONAL BIOPSY Left    small area removed around areola, benign, many  years ago   CARPAL TUNNEL RELEASE     FOOT SURGERY      Family History  Problem Relation Age of Onset   Diabetes Mother    Hypertension Mother    Dementia Mother    Diabetes Father    Hypertension Father    Cancer Father        Colon   Urticaria Father    Rheum arthritis Sister    Colon polyps Sister    Urticaria Brother    Allergic rhinitis Neg Hx    Angioedema Neg Hx    Asthma Neg Hx    Eczema Neg Hx    Immunodeficiency Neg Hx     Social History   Socioeconomic History   Marital status: Married    Spouse name: Not on file   Number of children: Not on file   Years of education: Not on file   Highest education level: Not on file  Occupational History   Not on file  Tobacco Use   Smoking status: Never    Passive exposure: Never   Smokeless tobacco: Never  Vaping Use   Vaping status: Never Used  Substance and Sexual Activity   Alcohol use: Never   Drug use: Never   Sexual activity: Yes  Other Topics Concern   Not on file  Social History Narrative   Marital Status:  Married Brewing technologist)   Children:  Son Landry Mellow) 1991; Daughter (Chanell) 1997.   Pets:  None   Living Situation: Lives with spouse and children.    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      Objective    BP 116/71   Pulse 72   Temp 98.4 F (36.9 C) (Oral)   Resp 18   Ht 5\' 2"  (1.575 m)   Wt 175 lb  11.2 oz (79.7 kg)   SpO2 97%   BMI 32.14 kg/m   Physical Exam Vitals and nursing note reviewed.  Constitutional:      Appearance: Normal appearance. She is normal weight.  HENT:     Head: Normocephalic and atraumatic.  Right Ear: External ear normal.     Left Ear: External ear normal.     Nose: Nose normal.     Mouth/Throat:     Mouth: Mucous membranes are moist.     Pharynx: Oropharynx is clear.  Eyes:     Conjunctiva/sclera: Conjunctivae normal.     Pupils: Pupils are equal, round, and reactive to light.  Cardiovascular:     Rate and Rhythm: Normal rate and regular rhythm.     Pulses: Normal pulses.     Heart sounds: Normal heart sounds.  Pulmonary:     Effort: Pulmonary effort is normal.     Breath sounds: Normal breath sounds.  Abdominal:     General: Abdomen is flat. Bowel sounds are normal.  Skin:    General: Skin is warm.     Capillary Refill: Capillary refill takes less than 2 seconds.  Neurological:     General: No focal deficit present.     Mental Status: She is alert and oriented to person, place, and time. Mental status is at baseline.  Psychiatric:        Mood and Affect: Mood normal.        Behavior: Behavior normal.        Thought Content: Thought content normal.        Judgment: Judgment normal.   Last hemoglobin A1c Lab Results  Component Value Date   HGBA1C 6.8 (H) 11/02/2013        Assessment & Plan:   Problem List Items Addressed This Visit   None  Encounter to establish care with new doctor  Type 2 diabetes mellitus without complication, without long-term current use of insulin (HCC) -     Fifty50 Glucose Meter 2.0; Check sugar daily  Dispense: 1 kit; Refill: 0 -     FreeStyle Lancets; 1 each by Other route daily. Use as instructed  Dispense: 100 each; Refill: 5 -     FreeStyle Test; USE TO CHECK BLOOD SUGAR DAILY  Dispense: 100 strip; Refill: 5 -     Hemoglobin A1c -     Comprehensive metabolic panel -     Microalbumin /  creatinine urine ratio  Shellfish allergy -     EPINEPHrine; Inject 0.34mls into the thigh muscle as needed for anaphylaxsis. May repeat dose once after 5 - 15 minutes if anaphylatic symptoms do not improve. Seek medical/emergency assitance.  Dispense: 2 each; Refill: 1  Mixed hyperlipidemia -     Atorvastatin Calcium; Take 1 tablet (20 mg total) by mouth at bedtime.  Dispense: 90 tablet; Refill: 1  Pharyngoesophageal dysphagia -     Ambulatory referral to Gastroenterology  Full incontinence of feces -     Ambulatory referral to Gastroenterology  Sent meter and testing supplies in for Diabetes. To get A1c, CMP, and urine micro today. Refilled Epipen for shellfish allergy. Refilled Atorvastatin 20mg  daily for HLD. Refer to GI for further evaluation and treatment options for dysphagia and bowel incontinence. No follow-ups on file.   Suzan Slick, MD

## 2023-04-23 LAB — HEMOGLOBIN A1C
Est. average glucose Bld gHb Est-mCnc: 174 mg/dL
Hgb A1c MFr Bld: 7.7 % — ABNORMAL HIGH (ref 4.8–5.6)

## 2023-04-23 LAB — COMPREHENSIVE METABOLIC PANEL
ALT: 25 [IU]/L (ref 0–32)
AST: 25 [IU]/L (ref 0–40)
Albumin: 3.9 g/dL (ref 3.9–4.9)
Alkaline Phosphatase: 82 [IU]/L (ref 44–121)
BUN/Creatinine Ratio: 13 (ref 12–28)
BUN: 13 mg/dL (ref 8–27)
Bilirubin Total: <0.2 mg/dL (ref 0.0–1.2)
CO2: 21 mmol/L (ref 20–29)
Calcium: 8.9 mg/dL (ref 8.7–10.3)
Chloride: 104 mmol/L (ref 96–106)
Creatinine, Ser: 0.97 mg/dL (ref 0.57–1.00)
Globulin, Total: 3.3 g/dL (ref 1.5–4.5)
Glucose: 99 mg/dL (ref 70–99)
Potassium: 4.7 mmol/L (ref 3.5–5.2)
Sodium: 138 mmol/L (ref 134–144)
Total Protein: 7.2 g/dL (ref 6.0–8.5)
eGFR: 65 mL/min/{1.73_m2} (ref >59)

## 2023-04-23 LAB — MICROALBUMIN / CREATININE URINE RATIO
Creatinine, Urine: 95.2 mg/dL
Microalb/Creat Ratio: <3 mg/g{creat} (ref 0–29)
Microalbumin, Urine: <3 ug/mL

## 2023-04-24 ENCOUNTER — Encounter: Payer: Self-pay | Admitting: Family Medicine

## 2023-04-25 ENCOUNTER — Other Ambulatory Visit: Payer: Self-pay | Admitting: Family Medicine

## 2023-04-25 ENCOUNTER — Other Ambulatory Visit (HOSPITAL_BASED_OUTPATIENT_CLINIC_OR_DEPARTMENT_OTHER): Payer: Self-pay

## 2023-04-25 MED ORDER — MOUNJARO 5 MG/0.5ML ~~LOC~~ SOAJ
5.0000 mg | SUBCUTANEOUS | 0 refills | Status: DC
  Filled 2023-04-25: qty 2, 28d supply, fill #0

## 2023-06-27 ENCOUNTER — Other Ambulatory Visit (HOSPITAL_BASED_OUTPATIENT_CLINIC_OR_DEPARTMENT_OTHER): Payer: Self-pay

## 2023-06-28 ENCOUNTER — Other Ambulatory Visit (HOSPITAL_BASED_OUTPATIENT_CLINIC_OR_DEPARTMENT_OTHER): Payer: Self-pay

## 2023-07-04 ENCOUNTER — Other Ambulatory Visit: Payer: Self-pay

## 2023-07-04 ENCOUNTER — Other Ambulatory Visit (HOSPITAL_BASED_OUTPATIENT_CLINIC_OR_DEPARTMENT_OTHER): Payer: Self-pay

## 2023-07-04 MED ORDER — SOLIFENACIN SUCCINATE 10 MG PO TABS
10.0000 mg | ORAL_TABLET | Freq: Every day | ORAL | 0 refills | Status: DC
  Filled 2023-07-04: qty 90, 90d supply, fill #0

## 2023-07-12 ENCOUNTER — Other Ambulatory Visit: Payer: Self-pay | Admitting: Family Medicine

## 2023-07-12 DIAGNOSIS — E782 Mixed hyperlipidemia: Secondary | ICD-10-CM

## 2023-07-12 NOTE — Telephone Encounter (Unsigned)
 Copied from CRM [id number]. Topic: Clinical - Medication Refill >> Jul 12, 2023 12:21 PM Higinio Roger wrote: Most Recent Primary Care Visit:  Provider: Suzan Slick  Department: [facility]  Visit Type: NEW PT - OFFICE VISIT  Date: 04/22/2023  Medication: SYNJARDY XR 12.07-998 MG TB24  atorvastatin (LIPITOR) 20 MG tablet   Has the patient contacted their pharmacy? Yes (Agent: If no, request that the patient contact the pharmacy for the refill. If patient does not wish to contact the pharmacy document the reason why and proceed with request.) (Agent: If yes, when and what did the pharmacy advise?)  Is this the correct pharmacy for this prescription? Yes If no, delete pharmacy and type the correct one.  This is the patient's preferred pharmacy:   [facility] [place] [facility] [address] [place] [address] Phone: [phone] Fax: [phone]   Has the prescription been filled recently? Yes  Is the patient out of the medication? Yes  Has the patient been seen for an appointment in the last year OR does the patient have an upcoming appointment? Yes  Can we respond through MyChart? Yes  Agent: Please be advised that Rx refills may take up to 3 business days. We ask that you follow-up with your pharmacy.

## 2023-07-12 NOTE — Telephone Encounter (Signed)
 Pt has remaining refill for lipitor.

## 2023-07-15 ENCOUNTER — Other Ambulatory Visit (HOSPITAL_BASED_OUTPATIENT_CLINIC_OR_DEPARTMENT_OTHER): Payer: Self-pay

## 2023-07-15 MED ORDER — SYNJARDY XR 12.5-1000 MG PO TB24
2.0000 | ORAL_TABLET | Freq: Every day | ORAL | 0 refills | Status: DC
  Filled 2023-07-15: qty 180, 90d supply, fill #0

## 2023-08-09 ENCOUNTER — Other Ambulatory Visit (HOSPITAL_BASED_OUTPATIENT_CLINIC_OR_DEPARTMENT_OTHER): Payer: Self-pay

## 2023-08-09 ENCOUNTER — Other Ambulatory Visit: Payer: Self-pay | Admitting: Family Medicine

## 2023-08-09 MED ORDER — MOUNJARO 5 MG/0.5ML ~~LOC~~ SOAJ
5.0000 mg | SUBCUTANEOUS | 4 refills | Status: DC
  Filled 2023-08-09: qty 2, 28d supply, fill #0

## 2023-09-30 ENCOUNTER — Ambulatory Visit
Admission: EM | Admit: 2023-09-30 | Discharge: 2023-09-30 | Disposition: A | Discharge status: Home / Self Care | Attending: Family Medicine | Admitting: Family Medicine

## 2023-09-30 ENCOUNTER — Other Ambulatory Visit: Payer: Self-pay

## 2023-09-30 DIAGNOSIS — N61 Mastitis without abscess: Secondary | ICD-10-CM | POA: Diagnosis not present

## 2023-09-30 MED ORDER — MUPIROCIN 2 % EX OINT: 1.0000 | TOPICAL_OINTMENT | Freq: Two times a day (BID) | CUTANEOUS | 0 refills | Status: DC

## 2023-09-30 MED ORDER — DOXYCYCLINE HYCLATE 100 MG PO CAPS: 100.0000 mg | ORAL_CAPSULE | Freq: Two times a day (BID) | ORAL | 0 refills | Status: DC

## 2023-09-30 NOTE — ED Provider Notes (Signed)
 [facility]    CSN: [id number] Arrival date & time: 09/30/23  1908      History   Chief Complaint Chief Complaint  Patient presents with   Breast Pain    HPI Theresa Meyers is a 69 y.o. female.   Patient states she had a blackhead on her chest that she tried to extract.  She states that over time it has become red, painful, and swollen.  She is here to find out if she has an abscess, and to treat the skin infection.  No fever chills or malaise. Patient is reminded she is overdue for her mammogram    Past Medical History:  Diagnosis Date   Anemia    Colon polyp    Diabetes mellitus    Discoid lupus    Dr. Selma   Hypercholesteremia    Incontinence    Incontinence of bowel    Vitamin D  deficiency     Patient Active Problem List   Diagnosis Date Noted   Asymptomatic varicose veins of both lower extremities 02/21/2022   Shellfish allergy 08/17/2021   Cervical radiculopathy 12/31/2017   Carpal tunnel syndrome, left 12/31/2017   Type 2 diabetes mellitus without complication, without long-term current use of insulin (HCC) 12/26/2017   Urticaria 08/14/2017   Seasonal and perennial allergic rhinitis 08/14/2017   Allergic reaction 08/14/2017   Seasonal and perennial allergic rhinitis 08/14/2017   Mixed stress and urge urinary incontinence 07/23/2017   Hallux valgus (acquired), right foot 10/17/2016   Hallux valgus (acquired), left foot 10/17/2016   Hammer toe of left foot 10/17/2016   Hammer toe of right foot 10/17/2016   Bunion 03/09/2013   Plantar fasciitis, bilateral 03/09/2013   Vitamin D  deficiency 08/31/2012   Angioedema 08/05/2010   Discoid lupus 06/12/2010   Obesity 06/12/2010    Past Surgical History:  Procedure Laterality Date   BREAST EXCISIONAL BIOPSY Left    small area removed around areola, benign, many years ago   CARPAL TUNNEL RELEASE     FOOT SURGERY      OB History   No obstetric history on file.      Home Medications     Prior to Admission medications   Medication Sig Start Date End Date Taking? Authorizing Provider  doxycycline (VIBRAMYCIN) 100 MG capsule Take 1 capsule (100 mg total) by mouth 2 (two) times daily. 09/30/23  Yes Maranda Jamee Jacob, MD  mupirocin ointment (BACTROBAN) 2 % Apply 1 Application topically 2 (two) times daily. 09/30/23  Yes Maranda Jamee Jacob, MD  Assure Comfort Lancets 28G MISC 1 Stick by miscellaneous route daily. 07/27/22     atorvastatin  (LIPITOR) 20 MG tablet Take 1 tablet (20 mg total) by mouth at bedtime. 04/22/23   Colette Torrence GRADE, MD  Blood Glucose Monitoring Suppl (FIFTY50 GLUCOSE METER 2.0) w/Device KIT Check sugar daily 04/22/23   Colette Torrence GRADE, MD  Blood Pressure Monitoring (OMRON 3 SERIES BP MONITOR) DEVI Use as directed 08/15/22   Delores Morene JAYSON, [facility]  EPINEPHrine  0.3 mg/0.3 mL IJ SOAJ injection Inject 0.3mls into the thigh muscle as needed for anaphylaxsis. May repeat dose once after 5 - 15 minutes if anaphylatic symptoms do not improve. Seek medical/emergency assitance. 04/22/23   Colette Torrence GRADE, MD  glucose blood (FREESTYLE TEST STRIPS) test strip USE TO CHECK BLOOD SUGAR DAILY 04/22/23   Colette Torrence GRADE, MD  glucose blood test strip Use to check blood sugar once daily 09/15/20     Lancets (FREESTYLE) lancets  1 each by Other route daily. Use as instructed 04/22/23   Colette Torrence GRADE, MD  solifenacin  (VESICARE ) 10 MG tablet Take 1 tablet (10 mg total) by mouth daily. *Swallow tablet whole; do not crush, chew, or split.* 07/03/23     SYNJARDY  XR 12.07-998 MG TB24 Take 2 tablets by mouth daily. 07/15/23   Colette Torrence GRADE, MD  tirzepatide  (MOUNJARO ) 5 MG/0.5ML Pen Inject 5 mg under the skin once a week. MUST HAVE OFFICE VISIT AND LAB FOR FURTHER REFILLS 08/09/23   Colette Torrence GRADE, MD    Family History Family History  Problem Relation Age of Onset   Diabetes Mother    Hypertension Mother    Dementia Mother    Diabetes Father    Hypertension Father    Cancer  Father        Colon   Urticaria Father    Rheum arthritis Sister    Colon polyps Sister    Urticaria Brother    Allergic rhinitis Neg Hx    Angioedema Neg Hx    Asthma Neg Hx    Eczema Neg Hx    Immunodeficiency Neg Hx     Social History Social History   Tobacco Use   Smoking status: Never    Passive exposure: Never   Smokeless tobacco: Never  Vaping Use   Vaping status: Never Used  Substance Use Topics   Alcohol use: Never   Drug use: Never     Allergies   Shellfish-derived products   Review of Systems Review of Systems See HPI  Physical Exam Triage Vital Signs ED Triage Vitals  Encounter Vitals Group     BP 09/30/23 1918 (!) 146/82     Girls Systolic BP Percentile --      Girls Diastolic BP Percentile --      Boys Systolic BP Percentile --      Boys Diastolic BP Percentile --      Pulse Rate 09/30/23 1918 83     Resp 09/30/23 1918 16     Temp 09/30/23 1918 98.3 F (36.8 C)     Temp src --      SpO2 09/30/23 1918 97 %     Weight --      Height --      Head Circumference --      Peak Flow --      Pain Score 09/30/23 1919 2     Pain Loc --      Pain Education --      Exclude from Growth Chart --    No data found.  Updated Vital Signs BP 126/82   Pulse 83   Temp 98.3 F (36.8 C)   Resp 16   SpO2 97%       Physical Exam Constitutional:      General: She is not in acute distress.    Appearance: She is well-developed.  HENT:     Head: Normocephalic and atraumatic.   Eyes:     Conjunctiva/sclera: Conjunctivae normal.     Pupils: Pupils are equal, round, and reactive to light.    Cardiovascular:     Rate and Rhythm: Normal rate.  Pulmonary:     Effort: Pulmonary effort is normal. No respiratory distress.  Chest:   Abdominal:     General: There is no distension.     Palpations: Abdomen is soft.   Musculoskeletal:        General: Normal range of motion.     Cervical back:  Normal range of motion.   Skin:    General: Skin is warm  and dry.   Neurological:     Mental Status: She is alert.      UC Treatments / Results  Labs (all labs ordered are listed, but only abnormal results are displayed) Labs Reviewed - No data to display  EKG   Radiology No results found.  Procedures Procedures (including critical care time)  Medications Ordered in UC Medications - No data to display  Initial Impression / Assessment and Plan / UC Course  I have reviewed the triage vital signs and the nursing notes.  Pertinent labs & imaging results that were available during my care of the patient were reviewed by me and considered in my medical decision making (see chart for details).     Patient that I do not feel there is an abscess ready to be lanced at this time.  It may form into more of an abscess over time.  I recommend warm compresses and antibiotics.  Follow-up with PCP in a couple of days in case this needs more attention. Final C I explained ical Impressions(s) / UC Diagnoses   Final diagnoses:  Cellulitis of left breast     Discharge Instructions      Continue warm compresses Take Tylenol or ibuprofen  as needed for pain May use the mupirocin ointment 2 times a day Take antibiotic 2 times a day with food Make an appointment to see your PCP in a couple of days for follow-up   ED Prescriptions     Medication Sig Dispense Auth. Provider   doxycycline (VIBRAMYCIN) 100 MG capsule Take 1 capsule (100 mg total) by mouth 2 (two) times daily. 14 capsule Maranda Jamee Jacob, MD   mupirocin ointment (BACTROBAN) 2 % Apply 1 Application topically 2 (two) times daily. 22 g Maranda Jamee Jacob, MD      PDMP not reviewed this encounter.   Maranda Jamee Jacob, MD 09/30/23 [phone]

## 2023-09-30 NOTE — Discharge Instructions (Signed)
 Continue warm compresses Take Tylenol or ibuprofen  as needed for pain May use the mupirocin ointment 2 times a day Take antibiotic 2 times a day with food Make an appointment to see your PCP in a couple of days for follow-up

## 2023-09-30 NOTE — ED Triage Notes (Signed)
 Redness, swelling, pain to left breast since Saturday. No fever. No otc meds.

## 2023-10-06 ENCOUNTER — Ambulatory Visit
Admission: EM | Admit: 2023-10-06 | Discharge: 2023-10-06 | Disposition: A | Discharge status: Home / Self Care | Attending: Family Medicine | Admitting: Family Medicine

## 2023-10-06 ENCOUNTER — Other Ambulatory Visit: Payer: Self-pay

## 2023-10-06 DIAGNOSIS — N611 Abscess of the breast and nipple: Secondary | ICD-10-CM | POA: Diagnosis not present

## 2023-10-06 MED ORDER — SULFAMETHOXAZOLE-TRIMETHOPRIM 800-160 MG PO TABS: 1.0000 | ORAL_TABLET | Freq: Two times a day (BID) | ORAL | 0 refills | Status: AC

## 2023-10-06 MED ORDER — HYDROCODONE-ACETAMINOPHEN 7.5-325 MG PO TABS: 1.0000 | ORAL_TABLET | Freq: Four times a day (QID) | ORAL | 0 refills | Status: DC | PRN

## 2023-10-06 NOTE — Discharge Instructions (Signed)
 After you finish the doxycycline  switch to Septra  DS.  Take the sulfa  antibiotic 2 times a day for a week May continue warm compresses Take Tylenol  for mild pain.  Take hydrocodone  as needed for severe pain Do not drive on hydrocodone  Call your PCP tomorrow to make an appointment for next week

## 2023-10-06 NOTE — ED Provider Notes (Signed)
 Theresa Meyers CARE    CSN: [id number] Arrival date & time: 10/06/23  1502      History   Chief Complaint Chief Complaint  Patient presents with   Breast Pain    HPI Theresa Meyers is a 68 y.o. female.   Theresa Meyers was here several days ago for early abscess formation.  At that time there was a large indurated area but no fluctuance.  I started her on antibiotics warm compresses.  She is here today stating that she thinks the abscess needs to be drained.  She is tolerating the antibiotics.  She is using warmth.    Past Medical History:  Diagnosis Date   Anemia    Colon polyp    Diabetes mellitus    Discoid lupus    Dr. Selma   Hypercholesteremia    Incontinence    Incontinence of bowel    Vitamin D  deficiency     Patient Active Problem List   Diagnosis Date Noted   Asymptomatic varicose veins of both lower extremities 02/21/2022   Shellfish allergy 08/17/2021   Cervical radiculopathy 12/31/2017   Carpal tunnel syndrome, left 12/31/2017   Type 2 diabetes mellitus without complication, without long-term current use of insulin (HCC) 12/26/2017   Urticaria 08/14/2017   Seasonal and perennial allergic rhinitis 08/14/2017   Allergic reaction 08/14/2017   Seasonal and perennial allergic rhinitis 08/14/2017   Mixed stress and urge urinary incontinence 07/23/2017   Hallux valgus (acquired), right foot 10/17/2016   Hallux valgus (acquired), left foot 10/17/2016   Hammer toe of left foot 10/17/2016   Hammer toe of right foot 10/17/2016   Bunion 03/09/2013   Plantar fasciitis, bilateral 03/09/2013   Vitamin D  deficiency 08/31/2012   Angioedema 08/05/2010   Discoid lupus 06/12/2010   Obesity 06/12/2010    Past Surgical History:  Procedure Laterality Date   BREAST EXCISIONAL BIOPSY Left    small area removed around areola, benign, many years ago   CARPAL TUNNEL RELEASE     FOOT SURGERY      OB History   No obstetric history on file.      Home Medications     Prior to Admission medications   Medication Sig Start Date End Date Taking? Authorizing Provider  HYDROcodone -acetaminophen  (NORCO) 7.5-325 MG tablet Take 1 tablet by mouth every 6 (six) hours as needed for moderate pain (pain score 4-6). 10/06/23  Yes Maranda Jamee Jacob, MD  sulfamethoxazole -trimethoprim  (BACTRIM  DS) 800-160 MG tablet Take 1 tablet by mouth 2 (two) times daily for 7 days. 10/06/23 10/13/23 Yes Maranda Jamee Jacob, MD  Assure Comfort Lancets 28G MISC 1 Stick by miscellaneous route daily. 07/27/22     atorvastatin  (LIPITOR) 20 MG tablet Take 1 tablet (20 mg total) by mouth at bedtime. 04/22/23   Colette Torrence GRADE, MD  Blood Glucose Monitoring Suppl (FIFTY50 GLUCOSE METER 2.0) w/Device KIT Check sugar daily 04/22/23   Colette Torrence GRADE, MD  Blood Pressure Monitoring (OMRON 3 SERIES BP MONITOR) DEVI Use as directed 08/15/22   Delores Morene JAYSON, [facility]  doxycycline  (VIBRAMYCIN ) 100 MG capsule Take 1 capsule (100 mg total) by mouth 2 (two) times daily. 09/30/23   Maranda Jamee Jacob, MD  EPINEPHrine  0.3 mg/0.3 mL IJ SOAJ injection Inject 0.68mls into the thigh muscle as needed for anaphylaxsis. May repeat dose once after 5 - 15 minutes if anaphylatic symptoms do not improve. Seek medical/emergency assitance. 04/22/23   Colette Torrence GRADE, MD  glucose blood (FREESTYLE TEST STRIPS) test strip  USE TO CHECK BLOOD SUGAR DAILY 04/22/23   Colette Torrence GRADE, MD  glucose blood test strip Use to check blood sugar once daily 09/15/20     Lancets (FREESTYLE) lancets 1 each by Other route daily. Use as instructed 04/22/23   Colette Torrence GRADE, MD  mupirocin  ointment (BACTROBAN ) 2 % Apply 1 Application topically 2 (two) times daily. 09/30/23   Maranda Jamee Jacob, MD  solifenacin  (VESICARE ) 10 MG tablet Take 1 tablet (10 mg total) by mouth daily. *Swallow tablet whole; do not crush, chew, or split.* 07/03/23     SYNJARDY  XR 12.07-998 MG TB24 Take 2 tablets by mouth daily. 07/15/23   Colette Torrence GRADE, MD  tirzepatide   (MOUNJARO ) 5 MG/0.5ML Pen Inject 5 mg under the skin once a week. MUST HAVE OFFICE VISIT AND LAB FOR FURTHER REFILLS 08/09/23   Colette Torrence GRADE, MD    Family History Family History  Problem Relation Age of Onset   Diabetes Mother    Hypertension Mother    Dementia Mother    Diabetes Father    Hypertension Father    Cancer Father        Colon   Urticaria Father    Rheum arthritis Sister    Colon polyps Sister    Urticaria Brother    Allergic rhinitis Neg Hx    Angioedema Neg Hx    Asthma Neg Hx    Eczema Neg Hx    Immunodeficiency Neg Hx     Social History Social History   Tobacco Use   Smoking status: Never    Passive exposure: Never   Smokeless tobacco: Never  Vaping Use   Vaping status: Never Used  Substance Use Topics   Alcohol use: Never   Drug use: Never     Allergies   Shellfish-derived products   Review of Systems Review of Systems See HPI  Physical Exam Triage Vital Signs ED Triage Vitals  Encounter Vitals Group     BP 10/06/23 1506 119/79     Girls Systolic BP Percentile --      Girls Diastolic BP Percentile --      Boys Systolic BP Percentile --      Boys Diastolic BP Percentile --      Pulse Rate 10/06/23 1506 79     Resp 10/06/23 1506 16     Temp 10/06/23 1506 97.9 F (36.6 C)     Temp src --      SpO2 10/06/23 1506 96 %     Weight --      Height --      Head Circumference --      Peak Flow --      Pain Score 10/06/23 1508 2     Pain Loc --      Pain Education --      Exclude from Growth Chart --    No data found.  Updated Vital Signs BP 119/79   Pulse 79   Temp 97.9 F (36.6 C)   Resp 16   SpO2 96%       Physical Exam Constitutional:      General: She is not in acute distress.    Appearance: She is well-developed.  HENT:     Head: Normocephalic and atraumatic.  Eyes:     Conjunctiva/sclera: Conjunctivae normal.     Pupils: Pupils are equal, round, and reactive to light.  Cardiovascular:     Rate and Rhythm:  Normal rate.  Pulmonary:  Effort: Pulmonary effort is normal. No respiratory distress.  Chest:    Abdominal:     General: There is no distension.     Palpations: Abdomen is soft.  Musculoskeletal:        General: Normal range of motion.     Cervical back: Normal range of motion.  Skin:    General: Skin is warm and dry.  Neurological:     Mental Status: She is alert.      UC Treatments / Results  Labs (all labs ordered are listed, but only abnormal results are displayed) Labs Reviewed - No data to display  EKG   Radiology No results found.  Procedures Incision and Drainage  Date/Time: 10/06/2023 3:46 PM  Performed by: Maranda Jamee Jacob, MD Authorized by: Maranda Jamee Jacob, MD   Consent:    Consent obtained:  Verbal   Consent given by:  Patient   Risks discussed:  Incomplete drainage and pain   Alternatives discussed:  Delayed treatment Universal protocol:    Patient identity confirmed:  Verbally with patient Location:    Type:  Abscess   Location:  Trunk   Trunk location:  L breast Pre-procedure details:    Skin preparation:  Antiseptic wash Anesthesia:    Anesthesia method:  Local infiltration   Local anesthetic:  Lidocaine 1% WITH epi Procedure type:    Complexity:  Simple Procedure details:    Incision types:  Stab incision   Incision depth:  Subcutaneous   Wound management:  Probed and deloculated   Drainage:  Bloody and purulent   Drainage amount:  Moderate   Wound treatment:  Wound left open and drain placed Post-procedure details:    Procedure completion:  Tolerated  (including critical care time)  Medications Ordered in UC Medications - No data to display  Initial Impression / Assessment and Plan / UC Course  I have reviewed the triage vital signs and the nursing notes.  Pertinent labs & imaging results that were available during my care of the patient were reviewed by me and considered in my medical decision making (see chart for  details).     Patient has had a week of doxycycline  and this is incompletely resolved.  Will switch her to Septra  DS.  Follow-up with PCP Final Clinical Impressions(s) / UC Diagnoses   Final diagnoses:  Abscess of female breast     Discharge Instructions      After you finish the doxycycline  switch to Septra  DS.  Take the sulfa  antibiotic 2 times a day for a week May continue warm compresses Take Tylenol  for mild pain.  Take hydrocodone  as needed for severe pain Do not drive on hydrocodone  Call your PCP tomorrow to make an appointment for next week   ED Prescriptions     Medication Sig Dispense Auth. Provider   sulfamethoxazole -trimethoprim  (BACTRIM  DS) 800-160 MG tablet Take 1 tablet by mouth 2 (two) times daily for 7 days. 14 tablet Maranda Jamee Jacob, MD   HYDROcodone -acetaminophen  (NORCO) 7.5-325 MG tablet Take 1 tablet by mouth every 6 (six) hours as needed for moderate pain (pain score 4-6). 10 tablet Maranda Jamee Jacob, MD      I have reviewed the PDMP during this encounter.   Maranda Jamee Jacob, MD 10/06/23 [phone]

## 2023-10-06 NOTE — ED Triage Notes (Signed)
 Seen on 6/30 for swelling, pain to left breast. Reports area needs to be drained.

## 2023-10-11 ENCOUNTER — Ambulatory Visit
Admission: EM | Admit: 2023-10-11 | Discharge: 2023-10-11 | Disposition: A | Discharge status: Home / Self Care | Attending: Family Medicine | Admitting: Family Medicine

## 2023-10-11 ENCOUNTER — Ambulatory Visit: Payer: Self-pay

## 2023-10-11 DIAGNOSIS — N61 Mastitis without abscess: Secondary | ICD-10-CM

## 2023-10-11 DIAGNOSIS — N611 Abscess of the breast and nipple: Secondary | ICD-10-CM

## 2023-10-11 MED ORDER — AMOXICILLIN-POT CLAVULANATE 875-125 MG PO TABS: 1.0000 | ORAL_TABLET | Freq: Two times a day (BID) | ORAL | 0 refills | Status: AC

## 2023-10-11 NOTE — ED Provider Notes (Signed)
 Theresa Meyers CARE    CSN: [id number] Arrival date & time: 10/11/23  1830      History   Chief Complaint Chief Complaint  Patient presents with   breast abcess    HPI Theresa Meyers is a 68 y.o. female.   HPI 68 year old female presents with breast pain for 1 day.  Patient was evaluated here on 10/06/2023 for abscess of breast please see epic for that encounter note.  PMH significant for T2DM, lupus, and hypercholesterolemia.  Past Medical History:  Diagnosis Date   Anemia    Colon polyp    Diabetes mellitus    Discoid lupus    Dr. Selma   Hypercholesteremia    Incontinence    Incontinence of bowel    Vitamin D  deficiency     Patient Active Problem List   Diagnosis Date Noted   Asymptomatic varicose veins of both lower extremities 02/21/2022   Shellfish allergy 08/17/2021   Cervical radiculopathy 12/31/2017   Carpal tunnel syndrome, left 12/31/2017   Type 2 diabetes mellitus without complication, without long-term current use of insulin (HCC) 12/26/2017   Urticaria 08/14/2017   Seasonal and perennial allergic rhinitis 08/14/2017   Allergic reaction 08/14/2017   Seasonal and perennial allergic rhinitis 08/14/2017   Mixed stress and urge urinary incontinence 07/23/2017   Hallux valgus (acquired), right foot 10/17/2016   Hallux valgus (acquired), left foot 10/17/2016   Hammer toe of left foot 10/17/2016   Hammer toe of right foot 10/17/2016   Bunion 03/09/2013   Plantar fasciitis, bilateral 03/09/2013   Vitamin D  deficiency 08/31/2012   Angioedema 08/05/2010   Discoid lupus 06/12/2010   Obesity 06/12/2010    Past Surgical History:  Procedure Laterality Date   BREAST EXCISIONAL BIOPSY Left    small area removed around areola, benign, many years ago   CARPAL TUNNEL RELEASE     FOOT SURGERY      OB History   No obstetric history on file.      Home Medications    Prior to Admission medications   Medication Sig Start Date End Date Taking?  Authorizing Provider  amoxicillin -clavulanate (AUGMENTIN ) 875-125 MG tablet Take 1 tablet by mouth 2 (two) times daily for 10 days. 10/11/23 10/21/23 Yes Teddy Sharper, FNP  Assure Comfort Lancets 28G MISC 1 Stick by miscellaneous route daily. 07/27/22     atorvastatin  (LIPITOR) 20 MG tablet Take 1 tablet (20 mg total) by mouth at bedtime. 04/22/23   Colette Torrence GRADE, MD  Blood Glucose Monitoring Suppl (FIFTY50 GLUCOSE METER 2.0) w/Device KIT Check sugar daily 04/22/23   Colette Torrence GRADE, MD  Blood Pressure Monitoring (OMRON 3 SERIES BP MONITOR) DEVI Use as directed 08/15/22   Delores Morene JAYSON, [facility]  EPINEPHrine  0.3 mg/0.3 mL IJ SOAJ injection Inject 0.44mls into the thigh muscle as needed for anaphylaxsis. May repeat dose once after 5 - 15 minutes if anaphylatic symptoms do not improve. Seek medical/emergency assitance. 04/22/23   Colette Torrence GRADE, MD  glucose blood (FREESTYLE TEST STRIPS) test strip USE TO CHECK BLOOD SUGAR DAILY 04/22/23   Colette Torrence GRADE, MD  glucose blood test strip Use to check blood sugar once daily 09/15/20     HYDROcodone -acetaminophen  (NORCO) 7.5-325 MG tablet Take 1 tablet by mouth every 6 (six) hours as needed for moderate pain (pain score 4-6). 10/06/23   Maranda Jamee Jacob, MD  Lancets (FREESTYLE) lancets 1 each by Other route daily. Use as instructed 04/22/23   Colette Torrence GRADE, MD  mupirocin   ointment (BACTROBAN ) 2 % Apply 1 Application topically 2 (two) times daily. 09/30/23   Maranda Jamee Jacob, MD  sulfamethoxazole -trimethoprim  (BACTRIM  DS) 800-160 MG tablet Take 1 tablet by mouth 2 (two) times daily for 7 days. 10/06/23 10/13/23  Maranda Jamee Jacob, MD  SYNJARDY  XR 12.07-998 MG TB24 Take 2 tablets by mouth daily. 07/15/23   Colette Torrence GRADE, MD  tirzepatide  (MOUNJARO ) 5 MG/0.5ML Pen Inject 5 mg under the skin once a week. MUST HAVE OFFICE VISIT AND LAB FOR FURTHER REFILLS 08/09/23   Colette Torrence GRADE, MD    Family History Family History  Problem Relation Age of Onset    Diabetes Mother    Hypertension Mother    Dementia Mother    Diabetes Father    Hypertension Father    Cancer Father        Colon   Urticaria Father    Rheum arthritis Sister    Colon polyps Sister    Urticaria Brother    Allergic rhinitis Neg Hx    Angioedema Neg Hx    Asthma Neg Hx    Eczema Neg Hx    Immunodeficiency Neg Hx     Social History Social History   Tobacco Use   Smoking status: Never    Passive exposure: Never   Smokeless tobacco: Never  Vaping Use   Vaping status: Never Used  Substance Use Topics   Alcohol use: Never   Drug use: Never     Allergies   Shellfish-derived products   Review of Systems Review of Systems   Physical Exam Triage Vital Signs ED Triage Vitals  Encounter Vitals Group     BP      Girls Systolic BP Percentile      Girls Diastolic BP Percentile      Boys Systolic BP Percentile      Boys Diastolic BP Percentile      Pulse      Resp      Temp      Temp src      SpO2      Weight      Height      Head Circumference      Peak Flow      Pain Score      Pain Loc      Pain Education      Exclude from Growth Chart    No data found.  Updated Vital Signs BP 127/84   Pulse 62   Temp 97.7 F (36.5 C)   Resp 19   SpO2 98%    Physical Exam Vitals and nursing note reviewed.  Constitutional:      Appearance: Normal appearance. She is normal weight.  HENT:     Head: Normocephalic and atraumatic.     Mouth/Throat:     Mouth: Mucous membranes are moist.     Pharynx: Oropharynx is clear.  Eyes:     Extraocular Movements: Extraocular movements intact.     Conjunctiva/sclera: Conjunctivae normal.     Pupils: Pupils are equal, round, and reactive to light.  Cardiovascular:     Rate and Rhythm: Normal rate and regular rhythm.     Pulses: Normal pulses.     Heart sounds: Normal heart sounds.  Pulmonary:     Effort: Pulmonary effort is normal.     Breath sounds: Normal breath sounds. No wheezing, rhonchi or rales.   Musculoskeletal:        General: Normal range of motion.  Cervical back: Normal range of motion and neck supple.  Skin:    General: Skin is warm and dry.  Neurological:     General: No focal deficit present.     Mental Status: She is alert and oriented to person, place, and time. Mental status is at baseline.  Psychiatric:        Mood and Affect: Mood normal.        Behavior: Behavior normal.      UC Treatments / Results  Labs (all labs ordered are listed, but only abnormal results are displayed) Labs Reviewed - No data to display  EKG   Radiology No results found.  Procedures Procedures (including critical care time)  Medications Ordered in UC Medications - No data to display  Initial Impression / Assessment and Plan / UC Course  I have reviewed the triage vital signs and the nursing notes.  Pertinent labs & imaging results that were available during my care of the patient were reviewed by me and considered in my medical decision making (see chart for details).     MDM: 1.  Left breast abscess-Rx'd Augmentin  875/125 mg tablet: Take 1 tablet twice daily x 10 days; 2.  Cellulitis of left breast-Rx'd Augmentin  875/125 mg tablet: Take 1 tablet twice daily x 10 days. Advised patient to take medication as directed with food to completion.  Encouraged to increase daily water intake to 64 ounces per day while taking this medication.  Advised if symptoms worsen and/or unresolved please follow-up with your PCP or general surgery for further evaluation.  Patient discharged home, hemodynamically stable Final Clinical Impressions(s) / UC Diagnoses   Final diagnoses:  Left breast abscess  Cellulitis of left breast     Discharge Instructions      Advised patient to take medication as directed with food to completion.  Encouraged to increase daily water intake to 64 ounces per day while taking this medication.  Advised if symptoms worsen and/or unresolved please follow-up  with your PCP or general surgery for further evaluation.     ED Prescriptions     Medication Sig Dispense Auth. Provider   amoxicillin -clavulanate (AUGMENTIN ) 875-125 MG tablet Take 1 tablet by mouth 2 (two) times daily for 10 days. 20 tablet Theresa Levier, FNP      PDMP not reviewed this encounter.   Teddy Sharper, FNP 10/11/23 1933

## 2023-10-11 NOTE — Telephone Encounter (Signed)
 FYI Only or Action Required?: FYI only for provider.  Patient was last seen in primary care on 04/22/2023 by Colette Torrence GRADE, MD.  Called Nurse Triage reporting Wound Infection.  Symptoms began several weeks ago.  Interventions attempted: Prescription medications: Finished Doxy now on 5/7 days of Bactrim , using Mupirocin  to wound and washing in dial soap.  Symptoms are: gradually worsening.  Triage Disposition: See HCP Within 4 Hours (Or PCP Triage)  Patient/caregiver understands and will follow disposition?:  yes    Copied from CRM [id number]. Topic: Clinical - Red Word Triage >> Oct 11, 2023  1:34 PM Powell HERO wrote: Red Word that prompted transfer to Nurse Triage: Pimple on breast, left breast, became infected. Was given antibiotics for 7 days, took those and didn't see a difference. Went back to urgent care and they lanced it. It is still a hole there and sometimes it is draining some sort of liquid, still looks bad and worried abut the condition of it, causing discomfort. Wants advice on next steps. Reason for Disposition  [1] Looks infected (spreading redness, red streak, pus) AND [2] fever    afebrile  Answer Assessment - Initial Assessment Questions 1. LOCATION: Where is the wound located?      Left breast at 10 o'clock 2. WOUND APPEARANCE: What does the wound look like?      1/4 inch across and pt stated was deep  3. SIZE: If redness is present, ask: What is the size of the red area? (Inches, centimeters, or compare to size of a coin)      1/2 inch long and is deep  4. SPREAD: What's changed in the last day?  Do you see any red streaks coming from the wound?     Purulent drainage- no 5. ONSET: When did it start to look infected?      2 weeks ago 6. MECHANISM: How did the wound start, what was the cause?     Started as a pimple went to UC and they lanced it 7. PAIN: Is there any pain? If Yes, ask: How bad is the pain?   (Scale 1-10; or mild, moderate,  severe)     Mild to moderate when breast moves or occasion twinge  8. FEVER: Do you have a fever? If Yes, ask: What is your temperature, how was it measured, and when did it start?     no 9. OTHER SYMPTOMS: Do you have any other symptoms? (e.g., shaking chills, weakness, rash elsewhere on body)     no  Protocols used: Wound Infection-A-AH

## 2023-10-11 NOTE — ED Triage Notes (Signed)
 Pt presents to uc with co pimple on breast pt reports she attempted to pop it and instead of the drainage come out it started swelling she came here and was given an antibiotic. Pt reports she came back about a week later due to minimal improvement and she had it lanced. Pt reports she thinks its not getting better with purulent drainage.

## 2023-10-11 NOTE — Discharge Instructions (Addendum)
 Advised patient to take medication as directed with food to completion.  Encouraged to increase daily water intake to 64 ounces per day while taking this medication.  Advised if symptoms worsen and/or unresolved please follow-up with your PCP or general surgery for further evaluation.

## 2023-10-14 ENCOUNTER — Ambulatory Visit: Payer: Self-pay

## 2023-10-14 NOTE — Telephone Encounter (Signed)
 This encounter was created in error - please disregard.

## 2023-10-14 NOTE — Telephone Encounter (Signed)
 FYI Only or Action Required?: FYI only for provider.  Patient was last seen in primary care on 04/22/2023 by Colette Torrence GRADE, MD.  Called Nurse Triage reporting wound drainage .  Symptoms began several weeks ago.  Interventions attempted: Other: warm compresses/antibactrial soap/dressing changes and po abx (pt in on her 3rd round now).  Symptoms are: gradually worsening.  Triage Disposition: See Physician Within 24 Hours  Patient/caregiver understands and will follow disposition?: yes     Reason for Disposition  [1] Pus or cloudy fluid draining from wound AND [2] no fever  Answer Assessment - Initial Assessment Questions 1. LOCATION: Where is the wound located?      Left breast 10 'o clock 2. WOUND APPEARANCE: What does the wound look like?      Open wound draining blood and pus 4. SPREAD: What's changed in the last day?  Do you see any red streaks coming from the wound?     no 5. ONSET: When did it start to look infected?      Ongoing on 3 rd round of abx 8. FEVER: Do you have a fever? If Yes, ask: What is your temperature, how was it measured, and when did it start?     no 9. OTHER SYMPTOMS: Do you have any other symptoms? (e.g., shaking chills, weakness, rash elsewhere on body)     Yesterday purulent drainage and blood  Protocols used: Wound Infection-A-AH

## 2023-10-15 ENCOUNTER — Encounter: Payer: Self-pay | Admitting: Family Medicine

## 2023-10-15 ENCOUNTER — Other Ambulatory Visit (HOSPITAL_BASED_OUTPATIENT_CLINIC_OR_DEPARTMENT_OTHER): Payer: Self-pay

## 2023-10-15 ENCOUNTER — Ambulatory Visit: Admitting: Family Medicine

## 2023-10-15 VITALS — BP 120/77 | HR 85 | Temp 97.9°F | Resp 18 | Ht 62.0 in | Wt 176.0 lb

## 2023-10-15 DIAGNOSIS — E119 Type 2 diabetes mellitus without complications: Secondary | ICD-10-CM | POA: Diagnosis not present

## 2023-10-15 DIAGNOSIS — Z7984 Long term (current) use of oral hypoglycemic drugs: Secondary | ICD-10-CM | POA: Diagnosis not present

## 2023-10-15 DIAGNOSIS — N611 Abscess of the breast and nipple: Secondary | ICD-10-CM | POA: Diagnosis not present

## 2023-10-15 MED ORDER — MOUNJARO 5 MG/0.5ML ~~LOC~~ SOAJ
5.0000 mg | SUBCUTANEOUS | 3 refills | Status: DC
  Filled 2023-10-15: qty 2, 28d supply, fill #0
  Filled 2023-11-21: qty 2, 28d supply, fill #1
  Filled 2024-02-25: qty 2, 28d supply, fill #2

## 2023-10-15 NOTE — Progress Notes (Signed)
 Established Patient Office Visit  Subjective   Patient ID: Theresa Meyers, female    DOB: Jul 02, 1955  Age: 68 y.o. MRN: [id number]  No chief complaint on file.   HPI  Acute visit. Pt reports she started with a raised area on her left breast. She says she squeezed the area and the next day, her whole top area of her breast turned red. She went to urgent care on 6/30 and was given Doxy and Bactroban  ointment. She went back on 7/6 due to abscess formation. This was lanced at that time and put on Hydrocodone  and Bactrim . This didn't help and her left breast was still red and more swollen. She went back to urgent care on 7/11. The providers told her there was nothing more they can do. They put her on Augmentin  of which she's still on. She is here today for follow up. Area is still with area of induration and red but not as bad as it was initially.   Pt is diabetic. On Mounjaro  5mg  weekly and Syndardy 1 tab a day despite rx saying 2 tabs a day. Last A1c 7.7 in January.  Review of Systems  Skin:        Left breast abscess  All other systems reviewed and are negative.     Objective:     There were no vitals taken for this visit. BP Readings from Last 3 Encounters:  10/15/23 120/77  10/11/23 127/84  10/06/23 119/79      Physical Exam Vitals and nursing note reviewed.  Constitutional:      Appearance: Normal appearance. She is normal weight.  HENT:     Head: Normocephalic and atraumatic.     Right Ear: External ear normal.     Left Ear: External ear normal.     Nose: Nose normal.     Mouth/Throat:     Mouth: Mucous membranes are moist.     Pharynx: Oropharynx is clear.  Eyes:     Conjunctiva/sclera: Conjunctivae normal.     Pupils: Pupils are equal, round, and reactive to light.  Cardiovascular:     Rate and Rhythm: Normal rate.  Pulmonary:     Effort: Pulmonary effort is normal.  Skin:    General: Skin is warm.     Capillary Refill: Capillary refill takes less than 2  seconds.     Comments: Left breast area of induration and erythema  Neurological:     General: No focal deficit present.     Mental Status: She is alert and oriented to person, place, and time. Mental status is at baseline.  Psychiatric:        Mood and Affect: Mood normal.        Behavior: Behavior normal.        Thought Content: Thought content normal.        Judgment: Judgment normal.     No results found for any visits on 10/15/23.  Last hemoglobin A1c Lab Results  Component Value Date   HGBA1C 7.7 (H) 04/22/2023      The 10-year ASCVD risk score (Arnett DK, et al., 2019) is: 15.5%    Assessment & Plan:   Problem List Items Addressed This Visit   None  Type 2 diabetes mellitus without complication, without long-term current use of insulin (HCC) -     Hemoglobin A1c -     Mounjaro ; Inject 5 mg under the skin once a week. MUST HAVE OFFICE VISIT AND LAB FOR FURTHER REFILLS  Dispense: 2 mL; Refill: 3  Left breast abscess -     Ambulatory referral to General Surgery   Pt with diabetes. Refilled Mounjaro  5 mg weekly. To recheck A1c. Continue Synjardy  1 tab po daily for now pending A1c results. Pt with recurrent and unhealing left breast abscess. Refer to general surgery as she's had 3 rounds of antibiotics and no resolution of abscess.  No follow-ups on file.    Torrence CINDERELLA Barrier, MD

## 2023-10-16 ENCOUNTER — Ambulatory Visit: Payer: Self-pay | Admitting: Family Medicine

## 2023-10-16 LAB — HEMOGLOBIN A1C
Est. average glucose Bld gHb Est-mCnc: 177 mg/dL
Hgb A1c MFr Bld: 7.8 % — ABNORMAL HIGH (ref 4.8–5.6)

## 2023-10-17 ENCOUNTER — Other Ambulatory Visit (HOSPITAL_BASED_OUTPATIENT_CLINIC_OR_DEPARTMENT_OTHER): Payer: Self-pay

## 2023-10-18 ENCOUNTER — Encounter (HOSPITAL_BASED_OUTPATIENT_CLINIC_OR_DEPARTMENT_OTHER): Payer: Self-pay | Admitting: Surgery

## 2023-10-18 ENCOUNTER — Other Ambulatory Visit: Payer: Self-pay

## 2023-10-18 ENCOUNTER — Ambulatory Visit: Payer: Self-pay | Admitting: Surgery

## 2023-10-18 DIAGNOSIS — L723 Sebaceous cyst: Secondary | ICD-10-CM | POA: Diagnosis not present

## 2023-10-18 DIAGNOSIS — L089 Local infection of the skin and subcutaneous tissue, unspecified: Secondary | ICD-10-CM | POA: Diagnosis not present

## 2023-10-18 NOTE — H&P (Signed)
 Subjective   Chief Complaint: Breast Problem     History of Present Illness: Theresa Meyers is a 68 y.o. female who is seen today as an office consultation at the request of Dr. Robinson for evaluation of Breast Problem .   This is a 68 year old female with diabetes who presents with a 3-week history of a skin infection in her left breast.  The patient has never had any previous breast issues.  No family history of breast cancer.  Her last mammogram was in April 2024.  She is overdue for this year's mammogram.  The patient has a frequent issue with sebaceous cysts and blackheads of her skin across her back as well as both breasts.  She has never had an infection in the breast.  Recently she felt a small superficial mass around a skin pore in the upper medial left breast.  She squeezed this but was unable to get any drainage.  However, subsequently she developed some slight erythema and swelling around this area.  She was started on antibiotics by urgent care.  She had an incision and drainage with some minimal output.  She is referred to us  now for evaluation for surgery.  No imaging has been performed.  Review of Systems: A complete review of systems was obtained from the patient.  I have reviewed this information and discussed as appropriate with the patient.  See HPI as well for other ROS.  Review of Systems  Constitutional: Negative.   HENT: Negative.    Eyes: Negative.   Respiratory: Negative.    Cardiovascular: Negative.   Gastrointestinal: Negative.   Genitourinary: Negative.   Musculoskeletal: Negative.   Skin: Negative.   Neurological: Negative.   Endo/Heme/Allergies: Negative.   Psychiatric/Behavioral: Negative.        Medical History: Past Medical History:  Diagnosis Date   Diabetes mellitus without complication (CMS/HHS-HCC)    Hyperlipidemia     There is no problem list on file for this patient.   Past Surgical History:  Procedure Laterality Date   foot  surgery       Allergies  Allergen Reactions   Shellfish Containing Products Hives, Itching and Swelling    Current Outpatient Medications on File Prior to Visit  Medication Sig Dispense Refill   atorvastatin  (LIPITOR) 20 MG tablet Take 20 mg by mouth at bedtime     MOUNJARO  5 mg/0.5 mL pen injector Inject 5 mg subcutaneously     solifenacin  (VESICARE ) 10 MG tablet Take 10 mg by mouth     empagliflozin -metFORMIN  (SYNJARDY ) 12.5-1,000 mg tablet Synjardy      No current facility-administered medications on file prior to visit.    Family History  Problem Relation Age of Onset   Obesity Mother    High blood pressure (Hypertension) Mother    Diabetes Mother    High blood pressure (Hypertension) Father    Diabetes Father    Colon cancer Father    Diabetes Sister    Stroke Brother    Diabetes Brother      Social History   Tobacco Use  Smoking Status Never  Smokeless Tobacco Never     Social History   Socioeconomic History   Marital status: Unknown  Tobacco Use   Smoking status: Never   Smokeless tobacco: Never  Substance and Sexual Activity   Drug use: Never   Social Drivers of Health   Housing Stability: Unknown (10/18/2023)   Housing Stability Vital Sign    Homeless in the Last Year: No  Objective:    Vitals:   10/18/23 1116 10/18/23 1117  BP: 130/77   Pulse: 81   Temp: 36.7 C (98 F)   SpO2: 98%   Weight: 80.8 kg (178 lb 3.2 oz)   Height: 158.8 cm (5' 2.5)   PainSc:  0-No pain    Body mass index is 32.07 kg/m.  Physical Exam   Constitutional:  WDWN in NAD, conversant, no obvious deformities; lying in bed comfortably Eyes:  Pupils equal, round; sclera anicteric; moist conjunctiva; no lid lag HENT:  Oral mucosa moist; good dentition  Neck:  No masses palpated, trachea midline; no thyromegaly Lungs:  CTA bilaterally; normal respiratory effort Breasts:  symmetric, pendulous, no nipple changes; no palpable masses or lymphadenopathy on either  side. The upper medial left breast shows a 4 cm area of slight discoloration.  No angry erythema.  There is some minimal induration with a 7 mm incision in the center of this.  There is some minimal purulent drainage.  No fluctuance palpated.  Minimal tenderness.  There is a central 2 cm area of firmness. CV:  Regular rate and rhythm; no murmurs; extremities well-perfused with no edema Abd:  +bowel sounds, soft, non-tender, no palpable organomegaly; no palpable hernias Musc: Normal gait; no apparent clubbing or cyanosis in extremities Lymphatic:  No palpable cervical or axillary lymphadenopathy Skin:  Warm, dry; no sign of jaundice Psychiatric - alert and oriented x 4; calm mood and affect    Assessment and Plan:  Diagnoses and all orders for this visit:  Infected sebaceous cyst Comments: left breast    This area likely represents a ruptured/infected sebaceous cyst.  No indications for emergent surgery today.  However, I do recommend incision and drainage of this area under anesthesia to remove all of the infected tissue and to try to remove the entire cyst to allow proper healing.  Will try to perform this in the next few days early next week.  [name], MD  10/18/2023 11:34 AM

## 2023-10-18 NOTE — H&P (View-Only) (Signed)
 Subjective   Chief Complaint: Breast Problem     History of Present Illness: Theresa Meyers is a 68 y.o. female who is seen today as an office consultation at the request of Dr. Robinson for evaluation of Breast Problem .   This is a 68 year old female with diabetes who presents with a 3-week history of a skin infection in her left breast.  The patient has never had any previous breast issues.  No family history of breast cancer.  Her last mammogram was in April 2024.  She is overdue for this year's mammogram.  The patient has a frequent issue with sebaceous cysts and blackheads of her skin across her back as well as both breasts.  She has never had an infection in the breast.  Recently she felt a small superficial mass around a skin pore in the upper medial left breast.  She squeezed this but was unable to get any drainage.  However, subsequently she developed some slight erythema and swelling around this area.  She was started on antibiotics by urgent care.  She had an incision and drainage with some minimal output.  She is referred to us  now for evaluation for surgery.  No imaging has been performed.  Review of Systems: A complete review of systems was obtained from the patient.  I have reviewed this information and discussed as appropriate with the patient.  See HPI as well for other ROS.  Review of Systems  Constitutional: Negative.   HENT: Negative.    Eyes: Negative.   Respiratory: Negative.    Cardiovascular: Negative.   Gastrointestinal: Negative.   Genitourinary: Negative.   Musculoskeletal: Negative.   Skin: Negative.   Neurological: Negative.   Endo/Heme/Allergies: Negative.   Psychiatric/Behavioral: Negative.        Medical History: Past Medical History:  Diagnosis Date   Diabetes mellitus without complication (CMS/HHS-HCC)    Hyperlipidemia     There is no problem list on file for this patient.   Past Surgical History:  Procedure Laterality Date   foot  surgery       Allergies  Allergen Reactions   Shellfish Containing Products Hives, Itching and Swelling    Current Outpatient Medications on File Prior to Visit  Medication Sig Dispense Refill   atorvastatin  (LIPITOR) 20 MG tablet Take 20 mg by mouth at bedtime     MOUNJARO  5 mg/0.5 mL pen injector Inject 5 mg subcutaneously     solifenacin  (VESICARE ) 10 MG tablet Take 10 mg by mouth     empagliflozin -metFORMIN  (SYNJARDY ) 12.5-1,000 mg tablet Synjardy      No current facility-administered medications on file prior to visit.    Family History  Problem Relation Age of Onset   Obesity Mother    High blood pressure (Hypertension) Mother    Diabetes Mother    High blood pressure (Hypertension) Father    Diabetes Father    Colon cancer Father    Diabetes Sister    Stroke Brother    Diabetes Brother      Social History   Tobacco Use  Smoking Status Never  Smokeless Tobacco Never     Social History   Socioeconomic History   Marital status: Unknown  Tobacco Use   Smoking status: Never   Smokeless tobacco: Never  Substance and Sexual Activity   Drug use: Never   Social Drivers of Health   Housing Stability: Unknown (10/18/2023)   Housing Stability Vital Sign    Homeless in the Last Year: No  Objective:    Vitals:   10/18/23 1116 10/18/23 1117  BP: 130/77   Pulse: 81   Temp: 36.7 C (98 F)   SpO2: 98%   Weight: 80.8 kg (178 lb 3.2 oz)   Height: 158.8 cm (5' 2.5)   PainSc:  0-No pain    Body mass index is 32.07 kg/m.  Physical Exam   Constitutional:  WDWN in NAD, conversant, no obvious deformities; lying in bed comfortably Eyes:  Pupils equal, round; sclera anicteric; moist conjunctiva; no lid lag HENT:  Oral mucosa moist; good dentition  Neck:  No masses palpated, trachea midline; no thyromegaly Lungs:  CTA bilaterally; normal respiratory effort Breasts:  symmetric, pendulous, no nipple changes; no palpable masses or lymphadenopathy on either  side. The upper medial left breast shows a 4 cm area of slight discoloration.  No angry erythema.  There is some minimal induration with a 7 mm incision in the center of this.  There is some minimal purulent drainage.  No fluctuance palpated.  Minimal tenderness.  There is a central 2 cm area of firmness. CV:  Regular rate and rhythm; no murmurs; extremities well-perfused with no edema Abd:  +bowel sounds, soft, non-tender, no palpable organomegaly; no palpable hernias Musc: Normal gait; no apparent clubbing or cyanosis in extremities Lymphatic:  No palpable cervical or axillary lymphadenopathy Skin:  Warm, dry; no sign of jaundice Psychiatric - alert and oriented x 4; calm mood and affect    Assessment and Plan:  Diagnoses and all orders for this visit:  Infected sebaceous cyst Comments: left breast    This area likely represents a ruptured/infected sebaceous cyst.  No indications for emergent surgery today.  However, I do recommend incision and drainage of this area under anesthesia to remove all of the infected tissue and to try to remove the entire cyst to allow proper healing.  Will try to perform this in the next few days early next week.  [name], MD  10/18/2023 11:34 AM

## 2023-10-21 ENCOUNTER — Encounter (HOSPITAL_BASED_OUTPATIENT_CLINIC_OR_DEPARTMENT_OTHER)
Admission: RE | Admit: 2023-10-21 | Discharge: 2023-10-21 | Disposition: A | Discharge status: Home / Self Care | Source: Ambulatory Visit | Attending: Surgery | Admitting: Surgery

## 2023-10-21 DIAGNOSIS — Z7985 Long-term (current) use of injectable non-insulin antidiabetic drugs: Secondary | ICD-10-CM | POA: Diagnosis not present

## 2023-10-21 DIAGNOSIS — N6082 Other benign mammary dysplasias of left breast: Secondary | ICD-10-CM | POA: Diagnosis not present

## 2023-10-21 DIAGNOSIS — E119 Type 2 diabetes mellitus without complications: Secondary | ICD-10-CM | POA: Insufficient documentation

## 2023-10-21 DIAGNOSIS — Z01812 Encounter for preprocedural laboratory examination: Secondary | ICD-10-CM | POA: Diagnosis present

## 2023-10-21 DIAGNOSIS — L089 Local infection of the skin and subcutaneous tissue, unspecified: Secondary | ICD-10-CM | POA: Diagnosis not present

## 2023-10-21 DIAGNOSIS — Z833 Family history of diabetes mellitus: Secondary | ICD-10-CM | POA: Diagnosis not present

## 2023-10-21 DIAGNOSIS — Z01818 Encounter for other preprocedural examination: Secondary | ICD-10-CM | POA: Insufficient documentation

## 2023-10-21 DIAGNOSIS — Z79899 Other long term (current) drug therapy: Secondary | ICD-10-CM | POA: Diagnosis not present

## 2023-10-21 DIAGNOSIS — Z01810 Encounter for preprocedural cardiovascular examination: Secondary | ICD-10-CM | POA: Diagnosis present

## 2023-10-21 LAB — BASIC METABOLIC PANEL WITH GFR
Anion gap: 10 (ref 5–15)
BUN: 15 mg/dL (ref 8–23)
CO2: 25 mmol/L (ref 22–32)
Calcium: 9.3 mg/dL (ref 8.9–10.3)
Chloride: 103 mmol/L (ref 98–111)
Creatinine, Ser: 0.82 mg/dL (ref 0.44–1.00)
GFR, Estimated: >60 mL/min (ref >60)
Glucose, Bld: 145 mg/dL — ABNORMAL HIGH (ref 70–99)
Potassium: 4.4 mmol/L (ref 3.5–5.1)
Sodium: 138 mmol/L (ref 135–145)

## 2023-10-21 NOTE — Progress Notes (Signed)

## 2023-10-22 ENCOUNTER — Ambulatory Visit (HOSPITAL_BASED_OUTPATIENT_CLINIC_OR_DEPARTMENT_OTHER): Admitting: Anesthesiology

## 2023-10-22 ENCOUNTER — Other Ambulatory Visit: Payer: Self-pay

## 2023-10-22 ENCOUNTER — Encounter (HOSPITAL_BASED_OUTPATIENT_CLINIC_OR_DEPARTMENT_OTHER)
Admission: RE | Disposition: A | Discharge status: Home / Self Care | Payer: Self-pay | Source: Home / Self Care | Attending: Surgery

## 2023-10-22 ENCOUNTER — Encounter (HOSPITAL_BASED_OUTPATIENT_CLINIC_OR_DEPARTMENT_OTHER): Payer: Self-pay | Admitting: Surgery

## 2023-10-22 ENCOUNTER — Ambulatory Visit (HOSPITAL_BASED_OUTPATIENT_CLINIC_OR_DEPARTMENT_OTHER)
Admission: RE | Admit: 2023-10-22 | Discharge: 2023-10-22 | Disposition: A | Discharge status: Home / Self Care | Attending: Surgery | Admitting: Surgery

## 2023-10-22 DIAGNOSIS — Z7985 Long-term (current) use of injectable non-insulin antidiabetic drugs: Secondary | ICD-10-CM | POA: Diagnosis not present

## 2023-10-22 DIAGNOSIS — Z79899 Other long term (current) drug therapy: Secondary | ICD-10-CM | POA: Diagnosis not present

## 2023-10-22 DIAGNOSIS — L089 Local infection of the skin and subcutaneous tissue, unspecified: Secondary | ICD-10-CM | POA: Insufficient documentation

## 2023-10-22 DIAGNOSIS — Z833 Family history of diabetes mellitus: Secondary | ICD-10-CM | POA: Diagnosis not present

## 2023-10-22 DIAGNOSIS — N6082 Other benign mammary dysplasias of left breast: Secondary | ICD-10-CM | POA: Insufficient documentation

## 2023-10-22 DIAGNOSIS — N6002 Solitary cyst of left breast: Secondary | ICD-10-CM

## 2023-10-22 DIAGNOSIS — N61 Mastitis without abscess: Secondary | ICD-10-CM | POA: Diagnosis not present

## 2023-10-22 DIAGNOSIS — E119 Type 2 diabetes mellitus without complications: Secondary | ICD-10-CM | POA: Diagnosis not present

## 2023-10-22 DIAGNOSIS — L723 Sebaceous cyst: Secondary | ICD-10-CM | POA: Diagnosis not present

## 2023-10-22 DIAGNOSIS — L0889 Other specified local infections of the skin and subcutaneous tissue: Secondary | ICD-10-CM | POA: Diagnosis not present

## 2023-10-22 HISTORY — PX: INCISION AND DRAINAGE, ABSCESS, BREAST: SHX7594

## 2023-10-22 LAB — GLUCOSE, CAPILLARY
Glucose-Capillary: 140 mg/dL — ABNORMAL HIGH (ref 70–99)
Glucose-Capillary: 127 mg/dL — ABNORMAL HIGH (ref 70–99)

## 2023-10-22 SURGERY — INCISION AND DRAINAGE, ABSCESS, BREAST
Anesthesia: General | Site: Breast | Laterality: Left

## 2023-10-22 MED ORDER — OXYCODONE HCL 5 MG PO TABS
ORAL_TABLET | ORAL | Status: AC
  Filled 2023-10-22: qty 1

## 2023-10-22 MED ORDER — MIDAZOLAM HCL 5 MG/5ML IJ SOLN
INTRAMUSCULAR | Status: DC | PRN
  Administered 2023-10-22: 2 mg via INTRAVENOUS

## 2023-10-22 MED ORDER — LIDOCAINE HCL (CARDIAC) PF 100 MG/5ML IV SOSY
PREFILLED_SYRINGE | INTRAVENOUS | Status: DC | PRN
  Administered 2023-10-22: 60 mg via INTRAVENOUS

## 2023-10-22 MED ORDER — CEFAZOLIN SODIUM-DEXTROSE 2-4 GM/100ML-% IV SOLN
2.0000 g | INTRAVENOUS | Status: AC
  Administered 2023-10-22: 2 g via INTRAVENOUS

## 2023-10-22 MED ORDER — BUPIVACAINE-EPINEPHRINE 0.25% -1:200000 IJ SOLN
INTRAMUSCULAR | Status: DC | PRN
  Administered 2023-10-22: 10 mL

## 2023-10-22 MED ORDER — ACETAMINOPHEN 10 MG/ML IV SOLN: 1000.0000 mg | Freq: Once | INTRAVENOUS | Status: DC | PRN

## 2023-10-22 MED ORDER — ONDANSETRON HCL 4 MG/2ML IJ SOLN
INTRAMUSCULAR | Status: DC | PRN
Start: 2023-10-22 — End: 2023-10-22
  Administered 2023-10-22: 4 mg via INTRAVENOUS

## 2023-10-22 MED ORDER — ONDANSETRON HCL 4 MG/2ML IJ SOLN
INTRAMUSCULAR | Status: AC
  Filled 2023-10-22: qty 2

## 2023-10-22 MED ORDER — CHLORHEXIDINE GLUCONATE CLOTH 2 % EX PADS: 6.0000 | MEDICATED_PAD | Freq: Once | CUTANEOUS | Status: DC

## 2023-10-22 MED ORDER — PROPOFOL 10 MG/ML IV BOLUS
INTRAVENOUS | Status: AC
  Filled 2023-10-22: qty 20

## 2023-10-22 MED ORDER — ACETAMINOPHEN 500 MG PO TABS
ORAL_TABLET | ORAL | Status: AC
  Filled 2023-10-22: qty 2

## 2023-10-22 MED ORDER — LIDOCAINE 2% (20 MG/ML) 5 ML SYRINGE
INTRAMUSCULAR | Status: AC
  Filled 2023-10-22: qty 5

## 2023-10-22 MED ORDER — MIDAZOLAM HCL 2 MG/2ML IJ SOLN
INTRAMUSCULAR | Status: AC
  Filled 2023-10-22: qty 2

## 2023-10-22 MED ORDER — 0.9 % SODIUM CHLORIDE (POUR BTL) OPTIME
TOPICAL | Status: DC | PRN
Start: 2023-10-22 — End: 2023-10-22
  Administered 2023-10-22: 50 mL

## 2023-10-22 MED ORDER — OXYCODONE HCL 5 MG/5ML PO SOLN: 5.0000 mg | Freq: Once | ORAL | Status: AC | PRN

## 2023-10-22 MED ORDER — FENTANYL CITRATE (PF) 100 MCG/2ML IJ SOLN: 25.0000 ug | INTRAMUSCULAR | Status: DC | PRN

## 2023-10-22 MED ORDER — OXYCODONE HCL 5 MG PO TABS
5.0000 mg | ORAL_TABLET | Freq: Once | ORAL | Status: AC | PRN
  Administered 2023-10-22: 5 mg via ORAL

## 2023-10-22 MED ORDER — LACTATED RINGERS IV SOLN: INTRAVENOUS | Status: DC

## 2023-10-22 MED ORDER — PROPOFOL 10 MG/ML IV BOLUS
INTRAVENOUS | Status: DC | PRN
  Administered 2023-10-22: 50 mg via INTRAVENOUS
  Administered 2023-10-22: 130 mg via INTRAVENOUS

## 2023-10-22 MED ORDER — ACETAMINOPHEN 500 MG PO TABS
1000.0000 mg | ORAL_TABLET | ORAL | Status: AC
  Administered 2023-10-22: 1000 mg via ORAL

## 2023-10-22 MED ORDER — FENTANYL CITRATE (PF) 100 MCG/2ML IJ SOLN
INTRAMUSCULAR | Status: AC
  Filled 2023-10-22: qty 2

## 2023-10-22 MED ORDER — FENTANYL CITRATE (PF) 100 MCG/2ML IJ SOLN
INTRAMUSCULAR | Status: DC | PRN
  Administered 2023-10-22 (×2): 50 ug via INTRAVENOUS

## 2023-10-22 MED ORDER — CEFAZOLIN SODIUM-DEXTROSE 2-4 GM/100ML-% IV SOLN
INTRAVENOUS | Status: AC
  Filled 2023-10-22: qty 100

## 2023-10-22 SURGICAL SUPPLY — 36 items
BENZOIN TINCTURE PRP APPL 2/3 (GAUZE/BANDAGES/DRESSINGS) ×1 IMPLANT
BLADE SURG 15 STRL LF DISP TIS (BLADE) ×1 IMPLANT
CANISTER SUCT 1200ML W/VALVE (MISCELLANEOUS) IMPLANT
CHLORAPREP W/TINT 26 (MISCELLANEOUS) ×1 IMPLANT
COVER BACK TABLE 60X90IN (DRAPES) ×1 IMPLANT
COVER MAYO STAND STRL (DRAPES) ×1 IMPLANT
DRAPE LAPAROTOMY 100X72 PEDS (DRAPES) ×1 IMPLANT
DRAPE UTILITY XL STRL (DRAPES) ×1 IMPLANT
DRSG TEGADERM 2-3/8X2-3/4 SM (GAUZE/BANDAGES/DRESSINGS) IMPLANT
DRSG TEGADERM 4X4.75 (GAUZE/BANDAGES/DRESSINGS) IMPLANT
ELECT COATED BLADE 2.86 ST (ELECTRODE) ×1 IMPLANT
ELECTRODE REM PT RTRN 9FT ADLT (ELECTROSURGICAL) ×1 IMPLANT
GAUZE PACKING IODOFORM 1/2INX (GAUZE/BANDAGES/DRESSINGS) IMPLANT
GAUZE SPONGE 2X2 STRL 8-PLY (GAUZE/BANDAGES/DRESSINGS) IMPLANT
GAUZE SPONGE 4X4 12PLY STRL (GAUZE/BANDAGES/DRESSINGS) IMPLANT
GAUZE SPONGE 4X4 12PLY STRL LF (GAUZE/BANDAGES/DRESSINGS) IMPLANT
GLOVE BIO SURGEON STRL SZ7 (GLOVE) ×1 IMPLANT
GLOVE BIOGEL PI IND STRL 7.0 (GLOVE) IMPLANT
GLOVE BIOGEL PI IND STRL 7.5 (GLOVE) ×1 IMPLANT
GLOVE SURG SS PI 6.5 STRL IVOR (GLOVE) IMPLANT
GOWN STRL REUS W/ TWL LRG LVL3 (GOWN DISPOSABLE) ×2 IMPLANT
NDL HYPO 25X1 1.5 SAFETY (NEEDLE) ×1 IMPLANT
NEEDLE HYPO 25X1 1.5 SAFETY (NEEDLE) ×1 IMPLANT
NS IRRIG 1000ML POUR BTL (IV SOLUTION) ×1 IMPLANT
PACK BASIN DAY SURGERY FS (CUSTOM PROCEDURE TRAY) ×1 IMPLANT
PENCIL SMOKE EVACUATOR (MISCELLANEOUS) ×1 IMPLANT
SLEEVE SCD COMPRESS KNEE MED (STOCKING) ×1 IMPLANT
SPIKE FLUID TRANSFER (MISCELLANEOUS) ×1 IMPLANT
SPONGE T-LAP 4X18 ~~LOC~~+RFID (SPONGE) ×1 IMPLANT
STRIP CLOSURE SKIN 1/2X4 (GAUZE/BANDAGES/DRESSINGS) ×1 IMPLANT
SUT MNCRL AB 4-0 PS2 18 (SUTURE) ×1 IMPLANT
SUT VIC AB 3-0 SH 27X BRD (SUTURE) ×1 IMPLANT
SYR CONTROL 10ML LL (SYRINGE) ×1 IMPLANT
TOWEL GREEN STERILE FF (TOWEL DISPOSABLE) ×1 IMPLANT
TUBE CONNECTING 20X1/4 (TUBING) IMPLANT
YANKAUER SUCT BULB TIP NO VENT (SUCTIONS) IMPLANT

## 2023-10-22 NOTE — Anesthesia Procedure Notes (Signed)
 Procedure Name: LMA Insertion Date/Time: 10/22/2023 11:53 AM  Performed by: Pam Macario BROCKS, CRNAPre-anesthesia Checklist: Patient identified, Emergency Drugs available, Suction available, Patient being monitored and Timeout performed Patient Re-evaluated:Patient Re-evaluated prior to induction Oxygen Delivery Method: Circle system utilized Preoxygenation: Pre-oxygenation with 100% oxygen Induction Type: IV induction Ventilation: Mask ventilation without difficulty LMA: LMA inserted LMA Size: 4.0 Number of attempts: 1 Airway Equipment and Method: Bite block Placement Confirmation: positive ETCO2, breath sounds checked- equal and bilateral and CO2 detector Tube secured with: Tape Dental Injury: Teeth and Oropharynx as per pre-operative assessment

## 2023-10-22 NOTE — Op Note (Signed)
 Pre-Op diagnosis: Infected sebaceous cyst left breast Postop diagnosis: Same Procedure: Incision and drainage of infected sebaceous cyst of the left breast Surgeon:Leyton Brownlee K Mayan Kloepfer Anesthesia: General Indications: This is a 68 year old female who presents with 3-week history of a cyst in her left breast that has become infected and has been draining.  She presents now for definitive treatment.  Procedure: The patient the operating room placed in supine position operating table.  After adequate level general esthesia was obtained, her left breast was prepped with ChloraPrep draped sterile fashion.  Timeout was taken to ensure the proper patient appropriate procedure.  She has a small opening from previous attempt at an incisional drainage.  I inserted a hemostat into this opening and we encountered a small cyst.  There was no purulence encountered.  I made an elliptical incision around this area.  We dissected down into the subcutaneous tissues, excising entire cyst wall down to healthy appearing adipose tissue.  We inspected carefully for hemostasis.  We irrigated the wound thoroughly.  Infiltrated local anesthetic around this area.  We packed with iodoform gauze.  A dry dressing is placed.  The patient is extubated and brought back to room stable condition.  All sponge, instrument, and needle counts are correct.  Donnice POUR. Belinda, MD, [facility]  General Surgery   10/22/2023 12:17 PM

## 2023-10-22 NOTE — Interval H&P Note (Signed)
 History and Physical Interval Note:  10/22/2023 9:45 AM  Theresa Meyers  has presented today for surgery, with the diagnosis of INFECTED SEBACEOUS CYST LEFT BREAST.  The various methods of treatment have been discussed with the patient and family. After consideration of risks, benefits and other options for treatment, the patient has consented to  Procedure(s) with comments: INCISION AND DRAINAGE, ABSCESS, BREAST (Left) - LMA INCISION AND DRAINAGE OF INFECTED SEBACEOUS CYST LEFT BREAST as a surgical intervention.  The patient's history has been reviewed, patient examined, no change in status, stable for surgery.  I have reviewed the patient's chart and labs.  Questions were answered to the patient's satisfaction.     Donnice MARLA Lima

## 2023-10-22 NOTE — Transfer of Care (Signed)
 Immediate Anesthesia Transfer of Care Note  Patient: Theresa Meyers  Procedure(s) Performed: INCISION AND DRAINAGE ABSCESS LEFT BREAST (Left: Breast)  Patient Location: PACU  Anesthesia Type:General  Level of Consciousness: awake and drowsy  Airway & Oxygen Therapy: Patient Spontanous Breathing  Post-op Assessment: Report given to RN  Post vital signs: Reviewed and stable  Last Vitals:  Vitals Value Taken Time  BP 130/78 10/22/23 12:27  Temp 36.3 C 10/22/23 12:28  Pulse 66 10/22/23 12:28  Resp 13 10/22/23 12:28  SpO2 100 % 10/22/23 12:28  Vitals shown include unfiled device data.  Last Pain:  Vitals:   10/22/23 0954  TempSrc: Temporal  PainSc: 0-No pain      Patients Stated Pain Goal: 3 (10/22/23 0954)  Complications: No notable events documented.

## 2023-10-22 NOTE — Discharge Instructions (Addendum)
 Have a dressing tape on the wound.  This needs to be changed on a daily basis after you shower.  When you shower, leave the dressing in place.  When you get out of the shower, remove the dressing and remove the packing material from the wound.  Cut a short length of the packing gauze and gently pack it into the wound with a cotton swab.  Cover this with dry gauze and secure with tape.  The dressing needs to be changed at least once a day.  If you are unable to pack any gauze into the wound, simply apply some antibiotic ointment such as Neosporin to the wound and cover with dry gauze.    Post Anesthesia Home Care Instructions  Activity: Get plenty of rest for the remainder of the day. A responsible individual must stay with you for 24 hours following the procedure.  For the next 24 hours, DO NOT: -Drive a car -Advertising copywriter -Drink alcoholic beverages -Take any medication unless instructed by your physician -Make any legal decisions or sign important papers.  Meals: Start with liquid foods such as gelatin or soup. Progress to regular foods as tolerated. Avoid greasy, spicy, heavy foods. If nausea and/or vomiting occur, drink only clear liquids until the nausea and/or vomiting subsides. Call your physician if vomiting continues.  Special Instructions/Symptoms: Your throat may feel dry or sore from the anesthesia or the breathing tube placed in your throat during surgery. If this causes discomfort, gargle with warm salt water. The discomfort should disappear within 24 hours.  If you had a scopolamine patch placed behind your ear for the management of post- operative nausea and/or vomiting:  1. The medication in the patch is effective for 72 hours, after which it should be removed.  Wrap patch in a tissue and discard in the trash. Wash hands thoroughly with soap and water. 2. You may remove the patch earlier than 72 hours if you experience unpleasant side effects which may include dry mouth,  dizziness or visual disturbances. 3. Avoid touching the patch. Wash your hands with soap and water after contact with the patch.     Can have tylenol  after 4 pm if needed

## 2023-10-22 NOTE — Anesthesia Preprocedure Evaluation (Addendum)
 Anesthesia Evaluation  Patient identified by MRN, date of birth, ID band Patient awake    Reviewed: Allergy & Precautions, NPO status , Patient's Chart, lab work & pertinent test results  History of Anesthesia Complications Negative for: history of anesthetic complications  Airway Mallampati: II  TM Distance: >3 FB Neck ROM: Full    Dental  (+) Dental Advisory Given, Teeth Intact,    Pulmonary neg shortness of breath, neg sleep apnea, neg COPD, neg recent URI   breath sounds clear to auscultation       Cardiovascular (-) hypertension(-) angina (-) CHF  Rhythm:Regular     Neuro/Psych    GI/Hepatic negative GI ROS, Neg liver ROS,,,  Endo/Other  diabetes    Renal/GU negative Renal ROS     Musculoskeletal negative musculoskeletal ROS (+)    Abdominal   Peds  Hematology negative hematology ROS (+)   Anesthesia Other Findings   Reproductive/Obstetrics                              Anesthesia Physical Anesthesia Plan  ASA: 2  Anesthesia Plan: General   Post-op Pain Management: Tylenol  PO (pre-op)*   Induction: Intravenous  PONV Risk Score and Plan: 2 and Propofol  infusion, Treatment may vary due to age or medical condition, Ondansetron  and TIVA  Airway Management Planned: LMA  Additional Equipment: None  Intra-op Plan:   Post-operative Plan: Extubation in OR  Informed Consent: I have reviewed the patients History and Physical, chart, labs and discussed the procedure including the risks, benefits and alternatives for the proposed anesthesia with the patient or authorized representative who has indicated his/her understanding and acceptance.     Dental advisory given  Plan Discussed with: CRNA  Anesthesia Plan Comments:          Anesthesia Quick Evaluation

## 2023-10-22 NOTE — Anesthesia Postprocedure Evaluation (Signed)
 Anesthesia Post Note  Patient: Theresa Meyers  Procedure(s) Performed: INCISION AND DRAINAGE ABSCESS LEFT BREAST (Left: Breast)     Patient location during evaluation: PACU Anesthesia Type: General Level of consciousness: awake and alert Pain management: pain level controlled Vital Signs Assessment: post-procedure vital signs reviewed and stable Respiratory status: spontaneous breathing, nonlabored ventilation and respiratory function stable Cardiovascular status: blood pressure returned to baseline and stable Postop Assessment: no apparent nausea or vomiting Anesthetic complications: no   No notable events documented.  Last Vitals:  Vitals:   10/22/23 1300 10/22/23 1315  BP: 139/84 129/84  Pulse: 60 73  Resp: 17 16  Temp:  (!) 36.2 C  SpO2: 96% 95%    Last Pain:  Vitals:   10/22/23 1324  TempSrc:   PainSc: 2                  Aileena Iglesia

## 2023-10-23 ENCOUNTER — Encounter (HOSPITAL_BASED_OUTPATIENT_CLINIC_OR_DEPARTMENT_OTHER): Payer: Self-pay | Admitting: Surgery

## 2023-10-23 LAB — SURGICAL PATHOLOGY

## 2023-11-21 ENCOUNTER — Other Ambulatory Visit: Payer: Self-pay | Admitting: Family Medicine

## 2023-11-21 ENCOUNTER — Other Ambulatory Visit (HOSPITAL_BASED_OUTPATIENT_CLINIC_OR_DEPARTMENT_OTHER): Payer: Self-pay

## 2023-11-21 DIAGNOSIS — E782 Mixed hyperlipidemia: Secondary | ICD-10-CM

## 2023-11-22 ENCOUNTER — Other Ambulatory Visit (HOSPITAL_BASED_OUTPATIENT_CLINIC_OR_DEPARTMENT_OTHER): Payer: Self-pay

## 2023-11-22 MED ORDER — ATORVASTATIN CALCIUM 20 MG PO TABS
20.0000 mg | ORAL_TABLET | Freq: Every evening | ORAL | 1 refills | Status: AC
  Filled 2023-11-22: qty 90, 90d supply, fill #0
  Filled 2024-02-19: qty 90, 90d supply, fill #1

## 2023-11-23 ENCOUNTER — Other Ambulatory Visit (HOSPITAL_BASED_OUTPATIENT_CLINIC_OR_DEPARTMENT_OTHER): Payer: Self-pay

## 2023-11-23 ENCOUNTER — Other Ambulatory Visit: Payer: Self-pay

## 2023-11-23 MED ORDER — SOLIFENACIN SUCCINATE 10 MG PO TABS
10.0000 mg | ORAL_TABLET | Freq: Every day | ORAL | 0 refills | Status: DC
  Filled 2023-11-23: qty 90, 90d supply, fill #0

## 2023-11-27 ENCOUNTER — Other Ambulatory Visit (HOSPITAL_BASED_OUTPATIENT_CLINIC_OR_DEPARTMENT_OTHER): Payer: Self-pay

## 2023-11-28 ENCOUNTER — Ambulatory Visit (INDEPENDENT_AMBULATORY_CARE_PROVIDER_SITE_OTHER): Admitting: Family Medicine

## 2023-11-28 ENCOUNTER — Encounter: Payer: Self-pay | Admitting: Family Medicine

## 2023-11-28 VITALS — BP 119/72 | HR 75 | Temp 98.5°F | Resp 18 | Ht 62.0 in | Wt 178.6 lb

## 2023-11-28 DIAGNOSIS — Z13820 Encounter for screening for osteoporosis: Secondary | ICD-10-CM | POA: Diagnosis not present

## 2023-11-28 DIAGNOSIS — Z780 Asymptomatic menopausal state: Secondary | ICD-10-CM | POA: Diagnosis not present

## 2023-11-28 DIAGNOSIS — Z13220 Encounter for screening for lipoid disorders: Secondary | ICD-10-CM

## 2023-11-28 DIAGNOSIS — E119 Type 2 diabetes mellitus without complications: Secondary | ICD-10-CM

## 2023-11-28 DIAGNOSIS — Z23 Encounter for immunization: Secondary | ICD-10-CM | POA: Diagnosis not present

## 2023-11-28 DIAGNOSIS — K5900 Constipation, unspecified: Secondary | ICD-10-CM | POA: Insufficient documentation

## 2023-11-28 DIAGNOSIS — Z7985 Long-term (current) use of injectable non-insulin antidiabetic drugs: Secondary | ICD-10-CM

## 2023-11-28 DIAGNOSIS — Z0000 Encounter for general adult medical examination without abnormal findings: Secondary | ICD-10-CM

## 2023-11-28 DIAGNOSIS — Z7984 Long term (current) use of oral hypoglycemic drugs: Secondary | ICD-10-CM

## 2023-11-28 DIAGNOSIS — Z800 Family history of malignant neoplasm of digestive organs: Secondary | ICD-10-CM | POA: Insufficient documentation

## 2023-11-28 DIAGNOSIS — Z136 Encounter for screening for cardiovascular disorders: Secondary | ICD-10-CM | POA: Diagnosis not present

## 2023-11-28 NOTE — Progress Notes (Signed)
 Complete physical exam  Patient: Theresa Meyers   DOB: October 17, 1955   68 y.o. Female  MRN: [id number]  Subjective:    Chief Complaint  Patient presents with   Annual Exam    Theresa Meyers is a 68 y.o. female who presents today for a complete physical exam. She reports consuming a general diet. The patient does not participate in regular exercise at present. She generally feels well. She reports sleeping fairly well. She does not have additional problems to discuss today.  Pt would like PCV-20 She also needs order for dexa scan.   Most recent fall risk assessment:    11/28/2023    3:14 PM  Fall Risk   Falls in the past year? 0  Number falls in past yr: 0  Injury with Fall? 0  Risk for fall due to : No Fall Risks  Follow up Falls evaluation completed     Most recent depression screenings:    04/22/2023    2:03 PM  PHQ 2/9 Scores  PHQ - 2 Score 0  PHQ- 9 Score 0    Vision:Within last year  Patient Active Problem List   Diagnosis Date Noted   Family history of colon cancer 11/28/2023   Constipation, unspecified 11/28/2023   Asymptomatic varicose veins of both lower extremities 02/21/2022   Shellfish allergy 08/17/2021   Cervical radiculopathy 12/31/2017   Carpal tunnel syndrome, left 12/31/2017   Type 2 diabetes mellitus without complication, without long-term current use of insulin (HCC) 12/26/2017   Urticaria 08/14/2017   Seasonal and perennial allergic rhinitis 08/14/2017   Allergic reaction 08/14/2017   Seasonal and perennial allergic rhinitis 08/14/2017   Mixed stress and urge urinary incontinence 07/23/2017   Hallux valgus (acquired), right foot 10/17/2016   Hallux valgus (acquired), left foot 10/17/2016   Hammer toe of left foot 10/17/2016   Hammer toe of right foot 10/17/2016   Bunion 03/09/2013   Plantar fasciitis, bilateral 03/09/2013   Vitamin D  deficiency 08/31/2012   Angioedema 08/05/2010   Discoid lupus 06/12/2010   Obesity 06/12/2010   Past  Medical History:  Diagnosis Date   Colon polyp    Diabetes mellitus    Discoid lupus    Dr. Selma   Hypercholesteremia    Incontinence    Incontinence of bowel    Vitamin D  deficiency    Past Surgical History:  Procedure Laterality Date   BREAST EXCISIONAL BIOPSY Left    small area removed around areola, benign, many years ago   CARPAL TUNNEL RELEASE     FOOT SURGERY     INCISION AND DRAINAGE, ABSCESS, BREAST Left 10/22/2023   Procedure: INCISION AND DRAINAGE ABSCESS LEFT BREAST;  Surgeon: Belinda Cough, MD;  Location:  [facility];  Service: General;  Laterality: Left;  LMA INCISION AND DRAINAGE OF INFECTED SEBACEOUS CYST LEFT BREAST   Family Status  Relation Name Status   Mother  Alive   Father  Deceased   Sister  Alive   Brother  Alive   Neg Hx  (Not Specified)  No partnership data on file   Allergies  Allergen Reactions   Shellfish-Derived Products Hives, Itching and Swelling      Patient Care Team: Colette Torrence GRADE, MD as PCP - General (Family Medicine)   Outpatient Medications Prior to Visit  Medication Sig   Assure Comfort Lancets 28G MISC 1 Stick by miscellaneous route daily.   atorvastatin  (LIPITOR) 20 MG tablet Take 1 tablet (20 mg total) by mouth  at bedtime.   Blood Glucose Monitoring Suppl (FIFTY50 GLUCOSE METER 2.0) w/Device KIT Check sugar daily   Blood Pressure Monitoring (OMRON 3 SERIES BP MONITOR) DEVI Use as directed   EPINEPHrine  0.3 mg/0.3 mL IJ SOAJ injection Inject 0.3mls into the thigh muscle as needed for anaphylaxsis. May repeat dose once after 5 - 15 minutes if anaphylatic symptoms do not improve. Seek medical/emergency assitance.   glucose blood (FREESTYLE TEST STRIPS) test strip USE TO CHECK BLOOD SUGAR DAILY   glucose blood test strip Use to check blood sugar once daily   HYDROcodone -acetaminophen  (NORCO) 7.5-325 MG tablet Take 1 tablet by mouth every 6 (six) hours as needed for moderate pain (pain score 4-6).   Lancets  (FREESTYLE) lancets 1 each by Other route daily. Use as instructed   mupirocin  ointment (BACTROBAN ) 2 % Apply 1 Application topically 2 (two) times daily.   solifenacin  (VESICARE ) 10 MG tablet Take by mouth daily.   solifenacin  (VESICARE ) 10 MG tablet Take 1 tablet (10 mg total) by mouth daily. Swallow tablet whole; do not crush, chew, or split.   SYNJARDY  XR 12.07-998 MG TB24 Take 2 tablets by mouth daily.   tirzepatide  (MOUNJARO ) 5 MG/0.5ML Pen Inject 5 mg under the skin once a week. MUST HAVE OFFICE VISIT AND LAB FOR FURTHER REFILLS   No facility-administered medications prior to visit.    Review of Systems  All other systems reviewed and are negative.         Objective:     BP 119/72   Pulse 75   Temp 98.5 F (36.9 C) (Oral)   Resp 18   Ht 5' 2 (1.575 m)   Wt 178 lb 9.6 oz (81 kg)   SpO2 99%   BMI 32.67 kg/m  BP Readings from Last 3 Encounters:  11/28/23 119/72  10/22/23 129/84  10/15/23 120/77      Physical Exam Vitals and nursing note reviewed.  Constitutional:      Appearance: Normal appearance. She is normal weight.  HENT:     Head: Normocephalic and atraumatic.     Right Ear: External ear normal.     Left Ear: External ear normal.     Nose: Nose normal.     Mouth/Throat:     Mouth: Mucous membranes are moist.     Pharynx: Oropharynx is clear.  Eyes:     Conjunctiva/sclera: Conjunctivae normal.     Pupils: Pupils are equal, round, and reactive to light.  Cardiovascular:     Rate and Rhythm: Normal rate and regular rhythm.     Pulses: Normal pulses.     Heart sounds: Normal heart sounds.  Pulmonary:     Effort: Pulmonary effort is normal.     Breath sounds: Normal breath sounds.  Abdominal:     General: Abdomen is flat. Bowel sounds are normal.  Skin:    General: Skin is warm.     Capillary Refill: Capillary refill takes less than 2 seconds.  Neurological:     General: No focal deficit present.     Mental Status: She is alert and oriented to  person, place, and time. Mental status is at baseline.  Psychiatric:        Mood and Affect: Mood normal.        Behavior: Behavior normal.        Thought Content: Thought content normal.        Judgment: Judgment normal.     No results found for any visits on 11/28/23.  Last CBC Lab Results  Component Value Date   WBC 3.8 08/14/2017   HGB 14.3 08/14/2017   HCT 43.4 08/14/2017   MCV 88 08/14/2017   MCH 29.0 08/14/2017   RDW 15.3 08/14/2017   PLT 335 08/14/2017   Last metabolic panel Lab Results  Component Value Date   GLUCOSE 145 (H) 10/21/2023   NA 138 10/21/2023   K 4.4 10/21/2023   CL 103 10/21/2023   CO2 25 10/21/2023   BUN 15 10/21/2023   CREATININE 0.82 10/21/2023   GFRNONAA >60 10/21/2023   CALCIUM  9.3 10/21/2023   PROT 7.2 04/22/2023   ALBUMIN 3.9 04/22/2023   LABGLOB 3.3 04/22/2023   AGRATIO 1.6 08/14/2017   BILITOT <0.2 04/22/2023   ALKPHOS 82 04/22/2023   AST 25 04/22/2023   ALT 25 04/22/2023   ANIONGAP 10 10/21/2023   Last lipids Lab Results  Component Value Date   CHOL 157 11/02/2013   HDL 54 11/02/2013   LDLCALC 93 11/02/2013   TRIG 48 11/02/2013   CHOLHDL 2.9 11/02/2013   Last hemoglobin A1c Lab Results  Component Value Date   HGBA1C 7.8 (H) 10/15/2023        Assessment & Plan:    Routine Health Maintenance and Physical Exam  Immunization History  Administered Date(s) Administered   Fluad  Trivalent(High Dose 65+) 01/10/2023   INFLUENZA, HIGH DOSE SEASONAL PF 12/14/2016   Influenza,inj,Quad PF,6+ Mos 01/25/2021, 01/17/2022   Influenza-Unspecified 12/02/2014, 01/01/2016   PFIZER(Purple Top)SARS-COV-2 Vaccination 08/25/2019, 09/16/2019   Pfizer Covid-19 Vaccine Bivalent Booster 11yrs & up 05/29/2021   Pneumococcal Polysaccharide-23 11/06/2013    Health Maintenance  Topic Date Due   Hepatitis C Screening  Never done   DTaP/Tdap/Td (1 - Tdap) Never done   Zoster Vaccines- Shingrix (1 of 2) Never done   OPHTHALMOLOGY EXAM   03/11/2014   Pneumococcal Vaccine: 50+ Years (2 of 2 - PCV) 11/07/2014   FOOT EXAM  11/07/2014   DEXA SCAN  Never done   COVID-19 Vaccine (4 - 2024-25 season) 12/02/2022   INFLUENZA VACCINE  11/01/2023   HEMOGLOBIN A1C  04/16/2024   Diabetic kidney evaluation - Urine ACR  04/21/2024   MAMMOGRAM  07/30/2024   Diabetic kidney evaluation - eGFR measurement  10/20/2024   Colonoscopy  01/03/2031   HPV VACCINES  Aged Out   Meningococcal B Vaccine  Aged Out    Discussed health benefits of physical activity, and encouraged her to engage in regular exercise appropriate for her age and condition.  Problem List Items Addressed This Visit   None  Return in about 7 weeks (around 01/16/2024) for Diabetes. Annual physical exam  Encounter for lipid screening for cardiovascular disease -     Lipid panel  Need for vaccination against Streptococcus pneumoniae -     Pneumococcal conjugate vaccine 20-valent  Type 2 diabetes mellitus without complication, without long-term current use of insulin (HCC) -     CBC with Differential/Platelet -     Comprehensive metabolic panel with GFR  Encounter for osteoporosis screening in asymptomatic postmenopausal patient -     DG Bone Density; Future   Pt to [facility] when fasting PCV-20 today Dexa scan ordered See back in October for diabetes follow up.    Torrence CINDERELLA Barrier, MD

## 2023-11-29 DIAGNOSIS — Z136 Encounter for screening for cardiovascular disorders: Secondary | ICD-10-CM | POA: Diagnosis not present

## 2023-11-29 DIAGNOSIS — E119 Type 2 diabetes mellitus without complications: Secondary | ICD-10-CM | POA: Diagnosis not present

## 2023-11-29 DIAGNOSIS — Z13220 Encounter for screening for lipoid disorders: Secondary | ICD-10-CM | POA: Diagnosis not present

## 2023-11-30 LAB — COMPREHENSIVE METABOLIC PANEL WITH GFR
ALT: 19 IU/L (ref 0–32)
AST: 14 IU/L (ref 0–40)
Albumin: 4.2 g/dL (ref 3.9–4.9)
Alkaline Phosphatase: 117 IU/L (ref 44–121)
BUN/Creatinine Ratio: 9 — ABNORMAL LOW (ref 12–28)
BUN: 8 mg/dL (ref 8–27)
Bilirubin Total: 0.4 mg/dL (ref 0.0–1.2)
CO2: 25 mmol/L (ref 20–29)
Calcium: 9.3 mg/dL (ref 8.7–10.3)
Chloride: 97 mmol/L (ref 96–106)
Creatinine, Ser: 0.85 mg/dL (ref 0.57–1.00)
Globulin, Total: 2.9 g/dL (ref 1.5–4.5)
Glucose: 248 mg/dL — ABNORMAL HIGH (ref 70–99)
Potassium: 5.4 mmol/L — ABNORMAL HIGH (ref 3.5–5.2)
Sodium: 134 mmol/L (ref 134–144)
Total Protein: 7.1 g/dL (ref 6.0–8.5)
eGFR: 76 mL/min/1.73 (ref >59)

## 2023-11-30 LAB — CBC WITH DIFFERENTIAL/PLATELET
Basophils Absolute: 0 x10E3/uL (ref 0.0–0.2)
Basos: 0 %
EOS (ABSOLUTE): 0.3 x10E3/uL (ref 0.0–0.4)
Eos: 6 %
Hematocrit: 47.6 % — ABNORMAL HIGH (ref 34.0–46.6)
Hemoglobin: 14.9 g/dL (ref 11.1–15.9)
Immature Grans (Abs): 0 x10E3/uL (ref 0.0–0.1)
Immature Granulocytes: 0 %
Lymphocytes Absolute: 1.9 x10E3/uL (ref 0.7–3.1)
Lymphs: 41 %
MCH: 28.8 pg (ref 26.6–33.0)
MCHC: 31.3 g/dL — ABNORMAL LOW (ref 31.5–35.7)
MCV: 92 fL (ref 79–97)
Monocytes Absolute: 0.4 x10E3/uL (ref 0.1–0.9)
Monocytes: 9 %
Neutrophils Absolute: 2 x10E3/uL (ref 1.4–7.0)
Neutrophils: 44 %
Platelets: 306 x10E3/uL (ref 150–450)
RBC: 5.18 x10E6/uL (ref 3.77–5.28)
RDW: 13.2 % (ref 11.7–15.4)
WBC: 4.6 x10E3/uL (ref 3.4–10.8)

## 2023-11-30 LAB — LIPID PANEL
Chol/HDL Ratio: 2.9 ratio (ref 0.0–4.4)
Cholesterol, Total: 197 mg/dL (ref 100–199)
HDL: 67 mg/dL (ref >39)
LDL Chol Calc (NIH): 116 mg/dL — ABNORMAL HIGH (ref 0–99)
Triglycerides: 76 mg/dL (ref 0–149)
VLDL Cholesterol Cal: 14 mg/dL (ref 5–40)

## 2023-12-03 ENCOUNTER — Ambulatory Visit: Payer: Self-pay | Admitting: Family Medicine

## 2024-01-01 DIAGNOSIS — Z01411 Encounter for gynecological examination (general) (routine) with abnormal findings: Secondary | ICD-10-CM | POA: Diagnosis not present

## 2024-01-01 DIAGNOSIS — R159 Full incontinence of feces: Secondary | ICD-10-CM | POA: Diagnosis not present

## 2024-01-07 ENCOUNTER — Other Ambulatory Visit (HOSPITAL_BASED_OUTPATIENT_CLINIC_OR_DEPARTMENT_OTHER): Payer: Self-pay

## 2024-01-14 ENCOUNTER — Other Ambulatory Visit (HOSPITAL_BASED_OUTPATIENT_CLINIC_OR_DEPARTMENT_OTHER)

## 2024-01-20 ENCOUNTER — Ambulatory Visit: Admitting: Family Medicine

## 2024-01-30 ENCOUNTER — Other Ambulatory Visit: Payer: Self-pay | Admitting: Medical Genetics

## 2024-01-30 DIAGNOSIS — Z006 Encounter for examination for normal comparison and control in clinical research program: Secondary | ICD-10-CM

## 2024-02-11 ENCOUNTER — Ambulatory Visit (HOSPITAL_BASED_OUTPATIENT_CLINIC_OR_DEPARTMENT_OTHER)
Admission: RE | Admit: 2024-02-11 | Discharge: 2024-02-11 | Disposition: A | Discharge status: Home / Self Care | Source: Ambulatory Visit | Attending: Family Medicine | Admitting: Family Medicine

## 2024-02-11 ENCOUNTER — Ambulatory Visit (HOSPITAL_BASED_OUTPATIENT_CLINIC_OR_DEPARTMENT_OTHER)
Admission: RE | Admit: 2024-02-11 | Discharge: 2024-02-11 | Disposition: A | Source: Ambulatory Visit | Attending: Family Medicine

## 2024-02-11 ENCOUNTER — Encounter (HOSPITAL_BASED_OUTPATIENT_CLINIC_OR_DEPARTMENT_OTHER): Payer: Self-pay

## 2024-02-11 ENCOUNTER — Other Ambulatory Visit (HOSPITAL_BASED_OUTPATIENT_CLINIC_OR_DEPARTMENT_OTHER): Payer: Self-pay | Admitting: Family Medicine

## 2024-02-11 ENCOUNTER — Other Ambulatory Visit (HOSPITAL_BASED_OUTPATIENT_CLINIC_OR_DEPARTMENT_OTHER): Payer: Self-pay

## 2024-02-11 DIAGNOSIS — Z1231 Encounter for screening mammogram for malignant neoplasm of breast: Secondary | ICD-10-CM

## 2024-02-11 DIAGNOSIS — M85851 Other specified disorders of bone density and structure, right thigh: Secondary | ICD-10-CM | POA: Diagnosis not present

## 2024-02-11 DIAGNOSIS — Z13820 Encounter for screening for osteoporosis: Secondary | ICD-10-CM | POA: Insufficient documentation

## 2024-02-11 DIAGNOSIS — M85852 Other specified disorders of bone density and structure, left thigh: Secondary | ICD-10-CM | POA: Diagnosis not present

## 2024-02-11 DIAGNOSIS — Z780 Asymptomatic menopausal state: Secondary | ICD-10-CM | POA: Insufficient documentation

## 2024-02-13 ENCOUNTER — Ambulatory Visit: Payer: Self-pay | Admitting: Family Medicine

## 2024-02-13 ENCOUNTER — Other Ambulatory Visit: Payer: Self-pay | Admitting: Family Medicine

## 2024-02-13 DIAGNOSIS — R928 Other abnormal and inconclusive findings on diagnostic imaging of breast: Secondary | ICD-10-CM

## 2024-02-19 ENCOUNTER — Other Ambulatory Visit (HOSPITAL_BASED_OUTPATIENT_CLINIC_OR_DEPARTMENT_OTHER): Payer: Self-pay

## 2024-02-19 ENCOUNTER — Other Ambulatory Visit: Payer: Self-pay | Admitting: Family Medicine

## 2024-02-20 ENCOUNTER — Other Ambulatory Visit: Payer: Self-pay

## 2024-02-20 ENCOUNTER — Other Ambulatory Visit (HOSPITAL_BASED_OUTPATIENT_CLINIC_OR_DEPARTMENT_OTHER): Payer: Self-pay

## 2024-02-20 MED ORDER — SYNJARDY XR 12.5-1000 MG PO TB24
2.0000 | ORAL_TABLET | Freq: Every day | ORAL | 0 refills | Status: DC
  Filled 2024-02-20: qty 180, 90d supply, fill #0

## 2024-02-20 MED ORDER — SOLIFENACIN SUCCINATE 10 MG PO TABS
ORAL_TABLET | ORAL | 0 refills | Status: AC
  Filled 2024-02-20: qty 90, 90d supply, fill #0

## 2024-02-24 ENCOUNTER — Other Ambulatory Visit

## 2024-02-24 ENCOUNTER — Encounter

## 2024-02-25 ENCOUNTER — Other Ambulatory Visit: Payer: Self-pay | Admitting: Family Medicine

## 2024-02-25 ENCOUNTER — Other Ambulatory Visit: Payer: Self-pay

## 2024-02-25 ENCOUNTER — Ambulatory Visit
Admission: EM | Admit: 2024-02-25 | Discharge: 2024-02-25 | Disposition: A | Discharge status: Home / Self Care | Attending: Internal Medicine | Admitting: Internal Medicine

## 2024-02-25 ENCOUNTER — Other Ambulatory Visit (HOSPITAL_BASED_OUTPATIENT_CLINIC_OR_DEPARTMENT_OTHER): Payer: Self-pay

## 2024-02-25 ENCOUNTER — Ambulatory Visit (INDEPENDENT_AMBULATORY_CARE_PROVIDER_SITE_OTHER)

## 2024-02-25 ENCOUNTER — Ambulatory Visit

## 2024-02-25 ENCOUNTER — Ambulatory Visit
Admission: RE | Admit: 2024-02-25 | Discharge: 2024-02-25 | Disposition: A | Discharge status: Home / Self Care | Source: Ambulatory Visit | Attending: Family Medicine | Admitting: Family Medicine

## 2024-02-25 DIAGNOSIS — M5441 Lumbago with sciatica, right side: Secondary | ICD-10-CM | POA: Diagnosis not present

## 2024-02-25 DIAGNOSIS — R921 Mammographic calcification found on diagnostic imaging of breast: Secondary | ICD-10-CM | POA: Diagnosis not present

## 2024-02-25 DIAGNOSIS — R928 Other abnormal and inconclusive findings on diagnostic imaging of breast: Secondary | ICD-10-CM

## 2024-02-25 DIAGNOSIS — M4807 Spinal stenosis, lumbosacral region: Secondary | ICD-10-CM | POA: Diagnosis not present

## 2024-02-25 DIAGNOSIS — M4316 Spondylolisthesis, lumbar region: Secondary | ICD-10-CM

## 2024-02-25 DIAGNOSIS — M47817 Spondylosis without myelopathy or radiculopathy, lumbosacral region: Secondary | ICD-10-CM | POA: Diagnosis not present

## 2024-02-25 DIAGNOSIS — M47816 Spondylosis without myelopathy or radiculopathy, lumbar region: Secondary | ICD-10-CM | POA: Diagnosis not present

## 2024-02-25 MED ORDER — TIZANIDINE HCL 4 MG PO TABS
4.0000 mg | ORAL_TABLET | Freq: Four times a day (QID) | ORAL | 0 refills | Status: DC | PRN
  Filled 2024-02-25: qty 30, 8d supply, fill #0

## 2024-02-25 MED ORDER — IBUPROFEN 600 MG PO TABS
600.0000 mg | ORAL_TABLET | Freq: Four times a day (QID) | ORAL | 0 refills | Status: DC | PRN
  Filled 2024-02-25: qty 30, 8d supply, fill #0

## 2024-02-25 MED ORDER — IBUPROFEN 800 MG PO TABS
800.0000 mg | ORAL_TABLET | Freq: Once | ORAL | Status: AC
  Administered 2024-02-25: 800 mg via ORAL

## 2024-02-25 NOTE — ED Triage Notes (Signed)
 Pt states she was on a rolling chair, putting on her shoes and the chair rolled out from under her and she fell on her buttocksx5 days ago. Pt denies hitting head. Pt c/o pain from right hip radiates down to right knee. Pt denies numbness or tingling. Pt walked well to exam room

## 2024-02-25 NOTE — Discharge Instructions (Addendum)
 X-ray of the lower back done today.  Final evaluation by the radiologist is still pending but on brief evaluation there does appear to be a spondylolisthesis in the lower back which is where the vertebrae have slightly slipped off of each other.  This can certainly cause the symptoms present.  This appears to be a low-grade which normally would be followed by an orthopedist and treated with physical therapy, monitoring and medication to help with pain.  We will send in prescription for anti-inflammatory to help with the pain and a low-dose muscle relaxer.  Recommend following up with orthopedics within the next week.  Avoid any heavy activity until you follow-up with orthopedics.  Concerning symptoms would be the development of numbness and tingling into the legs, loss of bowel or bladder or significantly worsening pain.  If this occurs then recommend going to the emergency room for further evaluation. Tizanidine  (Zanaflex ) 4 mg every 6 hours as needed for muscle spasms.  Use caution as this medication can cause drowsiness. Ibuprofen  600 mg every 6 hours as needed for pain Contact orthopedics and schedule an appointment within the next week for follow-up and further evaluation May alternate heat and ice to help with symptom relief May follow-up at urgent care as needed

## 2024-02-25 NOTE — ED Provider Notes (Signed)
 [facility]    CSN: [id number] Arrival date & time: 02/25/24  1235      History   Chief Complaint No chief complaint on file.   HPI Theresa Meyers is a 68 y.o. female.   68 year old female who presents urgent care with complaints of right lower back pain that is radiating into the buttocks and down through the thigh.  She reports that 5 days ago she was on a rolling stool trying to put on her shoes when it slipped out and she fell down onto her right buttocks.  She did not have any other injury.  She has had trouble with the pain in the lower back radiating into her leg since then.  The pain is much worse when she is standing.  She is able to walk although it is painful.  She denies any bowel or bladder incontinence.  She is not having any numbness or tingling in the leg.  She denies any previous injury or lower back problems.  She is not having any locking or giving out of the hip.  She denies any groin pain.     Past Medical History:  Diagnosis Date   Colon polyp    Diabetes mellitus    Discoid lupus    Dr. Selma   Hypercholesteremia    Incontinence    Incontinence of bowel    Vitamin D  deficiency     Patient Active Problem List   Diagnosis Date Noted   Family history of colon cancer 11/28/2023   Constipation, unspecified 11/28/2023   Asymptomatic varicose veins of both lower extremities 02/21/2022   Shellfish allergy 08/17/2021   Cervical radiculopathy 12/31/2017   Carpal tunnel syndrome, left 12/31/2017   Type 2 diabetes mellitus without complication, without long-term current use of insulin (HCC) 12/26/2017   Urticaria 08/14/2017   Seasonal and perennial allergic rhinitis 08/14/2017   Allergic reaction 08/14/2017   Seasonal and perennial allergic rhinitis 08/14/2017   Mixed stress and urge urinary incontinence 07/23/2017   Hallux valgus (acquired), right foot 10/17/2016   Hallux valgus (acquired), left foot 10/17/2016   Hammer toe of left foot  10/17/2016   Hammer toe of right foot 10/17/2016   Bunion 03/09/2013   Plantar fasciitis, bilateral 03/09/2013   Vitamin D  deficiency 08/31/2012   Angioedema 08/05/2010   Discoid lupus 06/12/2010   Obesity 06/12/2010    Past Surgical History:  Procedure Laterality Date   BREAST EXCISIONAL BIOPSY Left    small area removed around areola, benign, many years ago   CARPAL TUNNEL RELEASE     FOOT SURGERY     INCISION AND DRAINAGE, ABSCESS, BREAST Left 10/22/2023   Procedure: INCISION AND DRAINAGE ABSCESS LEFT BREAST;  Surgeon: Belinda Cough, MD;  Location: [facility];  Service: General;  Laterality: Left;  LMA INCISION AND DRAINAGE OF INFECTED SEBACEOUS CYST LEFT BREAST    OB History   No obstetric history on file.      Home Medications    Prior to Admission medications   Medication Sig Start Date End Date Taking? Authorizing Provider  ibuprofen  (ADVIL ) 600 MG tablet Take 1 tablet (600 mg total) by mouth every 6 (six) hours as needed. 02/25/24  Yes Charlynn Salih A, PA-C  tiZANidine  (ZANAFLEX ) 4 MG tablet Take 1 tablet (4 mg total) by mouth every 6 (six) hours as needed for muscle spasms. 02/25/24  Yes Axtyn Woehler A, PA-C  Assure Comfort Lancets 28G MISC 1 Stick by miscellaneous route daily. 07/27/22  atorvastatin  (LIPITOR) 20 MG tablet Take 1 tablet (20 mg total) by mouth at bedtime. 11/22/23   Colette Torrence GRADE, MD  Blood Glucose Monitoring Suppl (FIFTY50 GLUCOSE METER 2.0) w/Device KIT Check sugar daily 04/22/23   Colette Torrence GRADE, MD  Blood Pressure Monitoring (OMRON 3 SERIES BP MONITOR) DEVI Use as directed 08/15/22   Delores Morene BROCKS, [facility]  EPINEPHrine  0.3 mg/0.3 mL IJ SOAJ injection Inject 0.46mls into the thigh muscle as needed for anaphylaxsis. May repeat dose once after 5 - 15 minutes if anaphylatic symptoms do not improve. Seek medical/emergency assitance. 04/22/23   Colette Torrence GRADE, MD  glucose blood (FREESTYLE TEST STRIPS) test strip USE TO CHECK  BLOOD SUGAR DAILY 04/22/23   Colette Torrence GRADE, MD  glucose blood test strip Use to check blood sugar once daily 09/15/20     HYDROcodone -acetaminophen  (NORCO) 7.5-325 MG tablet Take 1 tablet by mouth every 6 (six) hours as needed for moderate pain (pain score 4-6). 10/06/23   Maranda Jamee Jacob, MD  Lancets (FREESTYLE) lancets 1 each by Other route daily. Use as instructed 04/22/23   Colette Torrence GRADE, MD  mupirocin  ointment (BACTROBAN ) 2 % Apply 1 Application topically 2 (two) times daily. 09/30/23   Maranda Jamee Jacob, MD  solifenacin  (VESICARE ) 10 MG tablet Take by mouth daily.    [provider]  solifenacin  (VESICARE ) 10 MG tablet Take 1 tablet (10 mg total) by mouth daily. Swallow tablet whole; do not crush, chew, or split. 02/20/24     SYNJARDY  XR 12.07-998 MG TB24 Take 2 tablets by mouth daily. 02/20/24   Booker Darice SAUNDERS, FNP  tirzepatide  (MOUNJARO ) 5 MG/0.5ML Pen Inject 5 mg under the skin once a week. MUST HAVE OFFICE VISIT AND LAB FOR FURTHER REFILLS 10/15/23   Colette Torrence GRADE, MD    Family History Family History  Problem Relation Age of Onset   Diabetes Mother    Hypertension Mother    Dementia Mother    Diabetes Father    Hypertension Father    Cancer Father        Colon   Urticaria Father    Rheum arthritis Sister    Colon polyps Sister    Urticaria Brother    Allergic rhinitis Neg Hx    Angioedema Neg Hx    Asthma Neg Hx    Eczema Neg Hx    Immunodeficiency Neg Hx     Social History Social History   Tobacco Use   Smoking status: Never    Passive exposure: Never   Smokeless tobacco: Never  Vaping Use   Vaping status: Never Used  Substance Use Topics   Alcohol use: Never   Drug use: Never     Allergies   Shellfish protein-containing drug products   Review of Systems Review of Systems  Constitutional:  Negative for chills and fever.  HENT:  Negative for ear pain and sore throat.   Eyes:  Negative for pain and visual disturbance.  Respiratory:   Negative for cough and shortness of breath.   Cardiovascular:  Negative for chest pain and palpitations.  Gastrointestinal:  Negative for abdominal pain and vomiting.  Genitourinary:  Negative for dysuria and hematuria.  Musculoskeletal:  Positive for back pain (Right lower with radiating into the right thigh). Negative for arthralgias.  Skin:  Negative for color change and rash.  Neurological:  Negative for seizures and syncope.  All other systems reviewed and are negative.    Physical Exam Triage Vital Signs ED Triage  Vitals  Encounter Vitals Group     BP 02/25/24 1243 125/84     Girls Systolic BP Percentile --      Girls Diastolic BP Percentile --      Boys Systolic BP Percentile --      Boys Diastolic BP Percentile --      Pulse Rate 02/25/24 1243 80     Resp 02/25/24 1243 16     Temp 02/25/24 1243 98 F (36.7 C)     Temp Source 02/25/24 1243 Oral     SpO2 02/25/24 1243 98 %     Weight --      Height --      Head Circumference --      Peak Flow --      Pain Score 02/25/24 1241 5     Pain Loc --      Pain Education --      Exclude from Growth Chart --    No data found.  Updated Vital Signs BP 125/84   Pulse 80   Temp 98 F (36.7 C) (Oral)   Resp 16   SpO2 98%   Visual Acuity Right Eye Distance:   Left Eye Distance:   Bilateral Distance:    Right Eye Near:   Left Eye Near:    Bilateral Near:     Physical Exam Vitals and nursing note reviewed.  Constitutional:      General: She is not in acute distress.    Appearance: She is well-developed.  HENT:     Head: Normocephalic and atraumatic.  Eyes:     Conjunctiva/sclera: Conjunctivae normal.  Cardiovascular:     Rate and Rhythm: Normal rate and regular rhythm.     Heart sounds: No murmur heard. Pulmonary:     Effort: Pulmonary effort is normal. No respiratory distress.     Breath sounds: Normal breath sounds.  Abdominal:     Palpations: Abdomen is soft.     Tenderness: There is no abdominal  tenderness.  Musculoskeletal:        General: No swelling.     Cervical back: Neck supple.     Lumbar back: Tenderness (Right lower) present. No swelling, deformity or signs of trauma. Normal range of motion. Negative right straight leg raise test and negative left straight leg raise test.     Right hip: No deformity or tenderness. Normal range of motion. Normal strength.  Skin:    General: Skin is warm and dry.     Capillary Refill: Capillary refill takes less than 2 seconds.  Neurological:     Mental Status: She is alert.  Psychiatric:        Mood and Affect: Mood normal.      UC Treatments / Results  Labs (all labs ordered are listed, but only abnormal results are displayed) Labs Reviewed - No data to display  EKG   Radiology MM 3D DIAGNOSTIC MAMMOGRAM UNILATERAL LEFT BREAST Result Date: 02/25/2024 CLINICAL DATA:  Recall from screening mammography, calcifications involving the retroareolar LEFT breast at anterior. EXAM: DIGITAL DIAGNOSTIC UNILATERAL LEFT MAMMOGRAM WITH TOMOSYNTHESIS AND CAD TECHNIQUE: Left digital diagnostic mammography and breast tomosynthesis was performed. The images were evaluated with computer-aided detection. COMPARISON:  Previous exam(s). ACR Breast Density Category b: There are scattered areas of fibroglandular density. FINDINGS: Full field CC, laterally exaggerated CC, MLO and mediolateral views and spot magnification CC and mediolateral views of the calcifications were obtained. There are 4 groups of likely benign dystrophic/fibroadenomatoid type calcifications in the  retroareolar breast at anterior depth. The largest group spans approximately 0.8 cm. The other 3 groups span 0.2 cm. There are no suspicious linear or branching forms in any of the groups. IMPRESSION: Likely benign calcifications involving the retroareolar LEFT breast at anterior depth. RECOMMENDATION: Diagnostic LEFT mammogram in 6 months to include spot magnification views of the  calcifications. I have discussed the findings and recommendations with the patient. If applicable, a reminder letter will be sent to the patient regarding the next appointment. BI-RADS CATEGORY  3: Probably benign. Electronically Signed   By: Debby Satterfield M.D.   On: 02/25/2024 12:07    Procedures Procedures (including critical care time)  Medications Ordered in UC Medications  ibuprofen  (ADVIL ) tablet 800 mg (800 mg Oral Given 02/25/24 1351)    Initial Impression / Assessment and Plan / UC Course  I have reviewed the triage vital signs and the nursing notes.  Pertinent labs & imaging results that were available during my care of the patient were reviewed by me and considered in my medical decision making (see chart for details).     Acute right-sided low back pain with right-sided sciatica - Plan: DG Lumbar Spine Complete, DG Lumbar Spine Complete  Spondylolisthesis of lumbar region   X-ray of the lower back done today.  Final evaluation by the radiologist is still pending but on brief evaluation there does appear to be a spondylolisthesis in the lower back which is where the vertebrae have slightly slipped off of each other.  This can certainly cause the symptoms present.  This appears to be a low-grade which normally would be followed by an orthopedist and treated with physical therapy, monitoring and medication to help with pain.  We will send in prescription for anti-inflammatory to help with the pain and a low-dose muscle relaxer.  Recommend following up with orthopedics within the next week.  Avoid any heavy activity until you follow-up with orthopedics.  Concerning symptoms would be the development of numbness and tingling into the legs, loss of bowel or bladder or significantly worsening pain.  If this occurs then recommend going to the emergency room for further evaluation. Tizanidine  (Zanaflex ) 4 mg every 6 hours as needed for muscle spasms.  Use caution as this medication can  cause drowsiness. Ibuprofen  600 mg every 6 hours as needed for pain Contact orthopedics and schedule an appointment within the next week for follow-up and further evaluation May alternate heat and ice to help with symptom relief May follow-up at urgent care as needed  Final Clinical Impressions(s) / UC Diagnoses   Final diagnoses:  Acute right-sided low back pain with right-sided sciatica  Spondylolisthesis of lumbar region     Discharge Instructions      X-ray of the lower back done today.  Final evaluation by the radiologist is still pending but on brief evaluation there does appear to be a spondylolisthesis in the lower back which is where the vertebrae have slightly slipped off of each other.  This can certainly cause the symptoms present.  This appears to be a low-grade which normally would be followed by an orthopedist and treated with physical therapy, monitoring and medication to help with pain.  We will send in prescription for anti-inflammatory to help with the pain and a low-dose muscle relaxer.  Recommend following up with orthopedics within the next week.  Avoid any heavy activity until you follow-up with orthopedics.  Concerning symptoms would be the development of numbness and tingling into the legs, loss of  bowel or bladder or significantly worsening pain.  If this occurs then recommend going to the emergency room for further evaluation. Tizanidine  (Zanaflex ) 4 mg every 6 hours as needed for muscle spasms.  Use caution as this medication can cause drowsiness. Ibuprofen  600 mg every 6 hours as needed for pain Contact orthopedics and schedule an appointment within the next week for follow-up and further evaluation May alternate heat and ice to help with symptom relief May follow-up at urgent care as needed     ED Prescriptions     Medication Sig Dispense Auth. Provider   tiZANidine  (ZANAFLEX ) 4 MG tablet Take 1 tablet (4 mg total) by mouth every 6 (six) hours as needed for  muscle spasms. 30 tablet Klever Twyford A, PA-C   ibuprofen  (ADVIL ) 600 MG tablet Take 1 tablet (600 mg total) by mouth every 6 (six) hours as needed. 30 tablet Teresa Almarie LABOR, [place]      PDMP not reviewed this encounter.   Teresa Almarie LABOR, [place] 02/25/24 1424

## 2024-03-03 ENCOUNTER — Ambulatory Visit: Payer: Self-pay | Admitting: Family Medicine

## 2024-03-05 ENCOUNTER — Ambulatory Visit
Admission: EM | Admit: 2024-03-05 | Discharge: 2024-03-05 | Disposition: A | Discharge status: Home / Self Care | Attending: Internal Medicine | Admitting: Internal Medicine

## 2024-03-05 ENCOUNTER — Encounter: Payer: Self-pay | Admitting: Emergency Medicine

## 2024-03-05 ENCOUNTER — Other Ambulatory Visit (HOSPITAL_BASED_OUTPATIENT_CLINIC_OR_DEPARTMENT_OTHER): Payer: Self-pay

## 2024-03-05 DIAGNOSIS — R300 Dysuria: Secondary | ICD-10-CM | POA: Insufficient documentation

## 2024-03-05 DIAGNOSIS — R350 Frequency of micturition: Secondary | ICD-10-CM | POA: Diagnosis not present

## 2024-03-05 DIAGNOSIS — N3001 Acute cystitis with hematuria: Secondary | ICD-10-CM | POA: Diagnosis not present

## 2024-03-05 LAB — POCT URINE DIPSTICK
Bilirubin, UA: NEGATIVE
Glucose, UA: >=1000 mg/dL — AB
Ketones, POC UA: NEGATIVE mg/dL
Nitrite, UA: NEGATIVE
POC PROTEIN,UA: >=300 — AB
Spec Grav, UA: 1.015 (ref 1.010–1.025)
Urobilinogen, UA: 0.2 U/dL
pH, UA: 5.5 (ref 5.0–8.0)

## 2024-03-05 LAB — POCT FASTING CBG KUC MANUAL ENTRY: POCT Glucose (KUC): 144 mg/dL — AB (ref 70–99)

## 2024-03-05 MED ORDER — SULFAMETHOXAZOLE-TRIMETHOPRIM 800-160 MG PO TABS
1.0000 | ORAL_TABLET | Freq: Two times a day (BID) | ORAL | 0 refills | Status: AC
  Filled 2024-03-05: qty 6, 3d supply, fill #0

## 2024-03-05 NOTE — ED Provider Notes (Signed)
 Theresa Meyers    CSN: [id number] Arrival date & time: 03/05/24  1441      History   Chief Complaint Chief Complaint  Patient presents with   Dysuria    HPI Theresa Meyers is a 68 y.o. female.   Patient presents with dysuria, urinary frequency, hematuria that started yesterday.  Denies fever, back pain, chills.  Denies history of frequent urinary tract infections.  Denies history of kidney stones.  Has not taken any medication for symptoms.   Dysuria   Past Medical History:  Diagnosis Date   Colon polyp    Diabetes mellitus    Discoid lupus    Dr. Selma   Hypercholesteremia    Incontinence    Incontinence of bowel    Vitamin D  deficiency     Patient Active Problem List   Diagnosis Date Noted   Family history of colon cancer 11/28/2023   Constipation, unspecified 11/28/2023   Asymptomatic varicose veins of both lower extremities 02/21/2022   Shellfish allergy 08/17/2021   Cervical radiculopathy 12/31/2017   Carpal tunnel syndrome, left 12/31/2017   Type 2 diabetes mellitus without complication, without long-term current use of insulin (HCC) 12/26/2017   Urticaria 08/14/2017   Seasonal and perennial allergic rhinitis 08/14/2017   Allergic reaction 08/14/2017   Seasonal and perennial allergic rhinitis 08/14/2017   Mixed stress and urge urinary incontinence 07/23/2017   Hallux valgus (acquired), right foot 10/17/2016   Hallux valgus (acquired), left foot 10/17/2016   Hammer toe of left foot 10/17/2016   Hammer toe of right foot 10/17/2016   Bunion 03/09/2013   Plantar fasciitis, bilateral 03/09/2013   Vitamin D  deficiency 08/31/2012   Angioedema 08/05/2010   Discoid lupus 06/12/2010   Obesity 06/12/2010    Past Surgical History:  Procedure Laterality Date   BREAST EXCISIONAL BIOPSY Left    small area removed around areola, benign, many years ago   CARPAL TUNNEL RELEASE     FOOT SURGERY     INCISION AND DRAINAGE, ABSCESS, BREAST Left 10/22/2023    Procedure: INCISION AND DRAINAGE ABSCESS LEFT BREAST;  Surgeon: Belinda Cough, MD;  Location: [facility];  Service: General;  Laterality: Left;  LMA INCISION AND DRAINAGE OF INFECTED SEBACEOUS CYST LEFT BREAST    OB History   No obstetric history on file.      Home Medications    Prior to Admission medications   Medication Sig Start Date End Date Taking? Authorizing Provider  Assure Comfort Lancets 28G MISC 1 Stick by miscellaneous route daily. 07/27/22  Yes   atorvastatin  (LIPITOR) 20 MG tablet Take 1 tablet (20 mg total) by mouth at bedtime. 11/22/23  Yes Colette Torrence GRADE, MD  Blood Glucose Monitoring Suppl (FIFTY50 GLUCOSE METER 2.0) w/Device KIT Check sugar daily 04/22/23  Yes Colette Torrence GRADE, MD  Blood Pressure Monitoring (OMRON 3 SERIES BP MONITOR) DEVI Use as directed 08/15/22  Yes Delores Morene JAYSON, [facility]  EPINEPHrine  0.3 mg/0.3 mL IJ SOAJ injection Inject 0.30mls into the thigh muscle as needed for anaphylaxsis. May repeat dose once after 5 - 15 minutes if anaphylatic symptoms do not improve. Seek medical/emergency assitance. 04/22/23  Yes Rucker, Torrence GRADE, MD  glucose blood (FREESTYLE TEST STRIPS) test strip USE TO CHECK BLOOD SUGAR DAILY 04/22/23  Yes Rucker, Torrence GRADE, MD  glucose blood test strip Use to check blood sugar once daily 09/15/20  Yes   HYDROcodone -acetaminophen  (NORCO) 7.5-325 MG tablet Take 1 tablet by mouth every 6 (six) hours as  needed for moderate pain (pain score 4-6). 10/06/23  Yes Maranda Jamee Jacob, MD  ibuprofen  (ADVIL ) 600 MG tablet Take 1 tablet (600 mg total) by mouth every 6 (six) hours as needed. 02/25/24  Yes White, Elizabeth A, PA-C  Lancets (FREESTYLE) lancets 1 each by Other route daily. Use as instructed 04/22/23  Yes Rucker, Torrence GRADE, MD  mupirocin  ointment (BACTROBAN ) 2 % Apply 1 Application topically 2 (two) times daily. 09/30/23  Yes Maranda Jamee Jacob, MD  solifenacin  (VESICARE ) 10 MG tablet Take by mouth daily.   Yes [provider]  solifenacin  (VESICARE ) 10 MG tablet Take 1 tablet (10 mg total) by mouth daily. Swallow tablet whole; do not crush, chew, or split. 02/20/24  Yes   sulfamethoxazole -trimethoprim  (BACTRIM  DS) 800-160 MG tablet Take 1 tablet by mouth 2 (two) times daily for 3 days. 03/05/24 03/08/24 Yes Winford Hehn, Darryle BRAVO, FNP  SYNJARDY  XR 12.07-998 MG TB24 Take 2 tablets by mouth daily. 02/20/24  Yes Booker Darice SAUNDERS, FNP  tirzepatide  (MOUNJARO ) 5 MG/0.5ML Pen Inject 5 mg under the skin once a week. MUST HAVE OFFICE VISIT AND LAB FOR FURTHER REFILLS 10/15/23  Yes Rucker, Torrence GRADE, MD  tiZANidine  (ZANAFLEX ) 4 MG tablet Take 1 tablet (4 mg total) by mouth every 6 (six) hours as needed for muscle spasms. 02/25/24  Yes Teresa Almarie LABOR, PA-C    Family History Family History  Problem Relation Age of Onset   Diabetes Mother    Hypertension Mother    Dementia Mother    Diabetes Father    Hypertension Father    Cancer Father        Colon   Urticaria Father    Rheum arthritis Sister    Colon polyps Sister    Urticaria Brother    Allergic rhinitis Neg Hx    Angioedema Neg Hx    Asthma Neg Hx    Eczema Neg Hx    Immunodeficiency Neg Hx     Social History Social History   Tobacco Use   Smoking status: Never    Passive exposure: Never   Smokeless tobacco: Never  Vaping Use   Vaping status: Never Used  Substance Use Topics   Alcohol use: Never   Drug use: Never     Allergies   Shellfish protein-containing drug products   Review of Systems Review of Systems Per HPI  Physical Exam Triage Vital Signs ED Triage Vitals  Encounter Vitals Group     BP 03/05/24 1455 127/80     Girls Systolic BP Percentile --      Girls Diastolic BP Percentile --      Boys Systolic BP Percentile --      Boys Diastolic BP Percentile --      Pulse Rate 03/05/24 1455 83     Resp 03/05/24 1455 18     Temp 03/05/24 1455 98.6 F (37 C)     Temp Source 03/05/24 1455 Oral     SpO2 03/05/24 1455 97 %      Weight 03/05/24 1454 175 lb (79.4 kg)     Height 03/05/24 1454 5' 2 (1.575 m)     Head Circumference --      Peak Flow --      Pain Score 03/05/24 1454 0     Pain Loc --      Pain Education --      Exclude from Growth Chart --    No data found.  Updated Vital Signs BP 127/80 (BP  Location: Right Arm)   Pulse 83   Temp 98.6 F (37 C) (Oral)   Resp 18   Ht 5' 2 (1.575 m)   Wt 175 lb (79.4 kg)   SpO2 97%   BMI 32.01 kg/m   Visual Acuity Right Eye Distance:   Left Eye Distance:   Bilateral Distance:    Right Eye Near:   Left Eye Near:    Bilateral Near:     Physical Exam Constitutional:      General: She is not in acute distress.    Appearance: Normal appearance. She is not toxic-appearing or diaphoretic.  HENT:     Head: Normocephalic and atraumatic.  Eyes:     Extraocular Movements: Extraocular movements intact.     Conjunctiva/sclera: Conjunctivae normal.  Pulmonary:     Effort: Pulmonary effort is normal.  Neurological:     General: No focal deficit present.     Mental Status: She is alert and oriented to person, place, and time. Mental status is at baseline.  Psychiatric:        Mood and Affect: Mood normal.        Behavior: Behavior normal.        Thought Content: Thought content normal.        Judgment: Judgment normal.      UC Treatments / Results  Labs (all labs ordered are listed, but only abnormal results are displayed) Labs Reviewed  POCT URINE DIPSTICK - Abnormal; Notable for the following components:      Result Value   Color, UA brown (*)    Clarity, UA cloudy (*)    Glucose, UA >=1,000 (*)    Blood, UA large (*)    POC PROTEIN,UA >=300 (*)    Leukocytes, UA Trace (*)    All other components within normal limits  POCT FASTING CBG [facility] MANUAL ENTRY - Abnormal; Notable for the following components:   POCT Glucose ([facility]) 144 (*)    All other components within normal limits  URINE CULTURE    EKG   Radiology No results  found.  Procedures Procedures (including critical Meyers time)  Medications Ordered in UC Medications - No data to display  Initial Impression / Assessment and Plan / UC Course  I have reviewed the triage vital signs and the nursing notes.  Pertinent labs & imaging results that were available during my Meyers of the patient were reviewed by me and considered in my medical decision making (see chart for details).     UA showing trace leukocytes but with associated symptoms, I am highly suspicious of urinary tract infection.  Patient does have glycosuria but blood sugar is 144 so suspect patient's daily medications is causing excretion of glucose in the urine.  Other differential includes kidney stone but this is less likely given no significant abdominal or back pain.  Will opt to treat with Bactrim  while awaiting urine culture.  Creatinine clearance is 82 so no dosage adjustment necessary.  Advised patient to ensure adequate water intake.  Also advised strict return and ER precautions.  Patient verbalized understanding and was agreeable with plan. Final Clinical Impressions(s) / UC Diagnoses   Final diagnoses:  Dysuria  Acute cystitis with hematuria  Urinary frequency   Discharge Instructions   None    ED Prescriptions     Medication Sig Dispense Auth. Provider   sulfamethoxazole -trimethoprim  (BACTRIM  DS) 800-160 MG tablet Take 1 tablet by mouth 2 (two) times daily for 3 days. 6 tablet Kmya Placide, [place]  E, FNP      PDMP not reviewed this encounter.   Hazen Darryle BRAVO, [place] 03/05/24 [phone]

## 2024-03-05 NOTE — ED Triage Notes (Signed)
 Patient c/o possible UTI, pressure yesterday, dysuria and hematuria last night and today.  Patient has not taking any OTC meds.

## 2024-03-06 ENCOUNTER — Telehealth: Payer: Self-pay

## 2024-03-06 NOTE — Telephone Encounter (Signed)
LMTRC if any questions or concerns. 

## 2024-03-07 ENCOUNTER — Other Ambulatory Visit (HOSPITAL_BASED_OUTPATIENT_CLINIC_OR_DEPARTMENT_OTHER): Payer: Self-pay

## 2024-03-07 ENCOUNTER — Telehealth: Payer: Self-pay | Admitting: Emergency Medicine

## 2024-03-07 LAB — URINE CULTURE: Culture: 20000 — AB

## 2024-03-07 MED ORDER — CEPHALEXIN 500 MG PO CAPS: 500.0000 mg | ORAL_CAPSULE | Freq: Two times a day (BID) | ORAL | 0 refills | Status: DC

## 2024-03-07 MED ORDER — CEPHALEXIN 500 MG PO CAPS
500.0000 mg | ORAL_CAPSULE | Freq: Two times a day (BID) | ORAL | 0 refills | Status: DC
  Filled 2024-03-07: qty 10, 5d supply, fill #0

## 2024-03-07 NOTE — Telephone Encounter (Signed)
Patient informed of Dr Francesca Oman recommendation.

## 2024-03-07 NOTE — Telephone Encounter (Signed)
 Patient has E. coli resistant to the prescription provided, Bactrim  DS.  Will change her to Keflex  based on sensitivities

## 2024-03-07 NOTE — Telephone Encounter (Addendum)
 Patient called regarding her lab results.  Patient was seen on 03/05/2024 and diagnosed w/UTI.  Started on antibiotics pending urine culture.  Patient states that she's not feeling any better.  Please advise.  [facility] [employer].

## 2024-03-08 ENCOUNTER — Other Ambulatory Visit: Payer: Self-pay

## 2024-03-08 DIAGNOSIS — K130 Diseases of lips: Secondary | ICD-10-CM | POA: Diagnosis not present

## 2024-03-08 DIAGNOSIS — L03211 Cellulitis of face: Secondary | ICD-10-CM | POA: Diagnosis not present

## 2024-03-09 ENCOUNTER — Ambulatory Visit: Payer: Self-pay | Admitting: Internal Medicine

## 2024-03-31 LAB — OPHTHALMOLOGY REPORT-SCANNED

## 2024-04-15 ENCOUNTER — Encounter: Payer: Self-pay | Admitting: Family Medicine

## 2024-04-15 ENCOUNTER — Ambulatory Visit: Admitting: Family Medicine

## 2024-04-15 ENCOUNTER — Other Ambulatory Visit (HOSPITAL_BASED_OUTPATIENT_CLINIC_OR_DEPARTMENT_OTHER): Payer: Self-pay

## 2024-04-15 VITALS — BP 131/81 | HR 85 | Temp 98.4°F | Ht 62.0 in | Wt 175.3 lb

## 2024-04-15 DIAGNOSIS — Z7985 Long-term (current) use of injectable non-insulin antidiabetic drugs: Secondary | ICD-10-CM | POA: Diagnosis not present

## 2024-04-15 DIAGNOSIS — E119 Type 2 diabetes mellitus without complications: Secondary | ICD-10-CM

## 2024-04-15 LAB — POCT GLYCOSYLATED HEMOGLOBIN (HGB A1C): Hemoglobin A1C: 9.2 % — AB (ref 4.0–5.6)

## 2024-04-15 MED ORDER — MOUNJARO 5 MG/0.5ML ~~LOC~~ SOAJ
5.0000 mg | SUBCUTANEOUS | 3 refills | Status: AC
  Filled 2024-04-15: qty 2, 28d supply, fill #0

## 2024-04-15 MED ORDER — SYNJARDY XR 12.5-1000 MG PO TB24
2.0000 | ORAL_TABLET | Freq: Every day | ORAL | 0 refills | Status: AC
  Filled 2024-04-15: qty 180, 90d supply, fill #0

## 2024-04-15 NOTE — Progress Notes (Signed)
 "  Established Patient Office Visit  Subjective   Patient ID: Theresa Meyers, female    DOB: 1955-09-21  Age: 69 y.o. MRN: [id number]  Chief Complaint  Patient presents with   Medical Management of Chronic Issues    HPI  Diabetes She feels like her sugars need to improve. Running 140s highest. She is taking Mounjaro  5mg  once a month and taking Synjardy  XR 2 tabs daily. She says her previous provider was given her 2 tabs a day but she was only taking 1 tab a day. She is doing once a month shot in order to maintain her weight. Diabetes is worsening, A1c 9.2.    Review of Systems  All other systems reviewed and are negative.     Objective:     BP 131/81 (BP Location: Left Arm, Patient Position: Sitting, Cuff Size: Normal)   Pulse 85   Temp 98.4 F (36.9 C) (Oral)   Ht 5' 2 (1.575 m)   Wt 175 lb 4.8 oz (79.5 kg)   SpO2 98%   BMI 32.06 kg/m  BP Readings from Last 3 Encounters:  04/15/24 131/81  03/05/24 127/80  02/25/24 125/84      Physical Exam Vitals and nursing note reviewed.  Constitutional:      Appearance: Normal appearance. She is normal weight.  HENT:     Head: Normocephalic and atraumatic.     Right Ear: External ear normal.     Left Ear: External ear normal.     Nose: Nose normal.     Mouth/Throat:     Mouth: Mucous membranes are moist.     Pharynx: Oropharynx is clear.  Eyes:     Conjunctiva/sclera: Conjunctivae normal.     Pupils: Pupils are equal, round, and reactive to light.  Cardiovascular:     Rate and Rhythm: Normal rate.  Pulmonary:     Effort: Pulmonary effort is normal.  Abdominal:     General: Abdomen is flat. Bowel sounds are normal.  Skin:    General: Skin is warm.     Capillary Refill: Capillary refill takes less than 2 seconds.  Neurological:     General: No focal deficit present.     Mental Status: She is alert and oriented to person, place, and time. Mental status is at baseline.  Psychiatric:        Mood and Affect: Mood  normal.        Behavior: Behavior normal.        Thought Content: Thought content normal.        Judgment: Judgment normal.      Results for orders placed or performed in visit on 04/15/24  POCT HgB A1C  Result Value Ref Range   Hemoglobin A1C 9.2 (A) 4.0 - 5.6 %   HbA1c POC (<> result, manual entry)     HbA1c, POC (prediabetic range)     HbA1c, POC (controlled diabetic range)      Last hemoglobin A1c Lab Results  Component Value Date   HGBA1C 9.2 (A) 04/15/2024      The 10-year ASCVD risk score (Arnett DK, et al., 2019) is: 19.5%    Assessment & Plan:   Problem List Items Addressed This Visit       Endocrine   Type 2 diabetes mellitus without complication, without long-term current use of insulin (HCC) - Primary   Relevant Medications   tirzepatide  (MOUNJARO ) 5 MG/0.5ML Pen   SYNJARDY  XR 12.07-998 MG TB24   Other Relevant Orders  POCT HgB A1C (Completed)   Lab Results  Component Value Date   HGBA1C 9.2 (A) 04/15/2024   HGBA1C 7.8 (H) 10/15/2023   HGBA1C 7.7 (H) 04/22/2023   Advised to go back on 2 a day of the Synjardy  and the weekly injection. Pt declines the shot and says she will start with the pills first  Return in about 3 months (around 07/14/2024) for Diabetes.    Torrence CINDERELLA Barrier, MD  "

## 2024-04-15 NOTE — Patient Instructions (Signed)
 OSCAL-D over the counter

## 2024-04-17 ENCOUNTER — Other Ambulatory Visit (HOSPITAL_COMMUNITY): Payer: Self-pay

## 2024-07-14 ENCOUNTER — Ambulatory Visit: Admitting: Family Medicine

## 2024-08-28 ENCOUNTER — Encounter
